# Patient Record
Sex: Male | Born: 1980 | Race: Black or African American | Hispanic: No | Marital: Single | Smoking: Current some day smoker
Health system: Southern US, Community
[De-identification: ages and names within clinical notes are randomized; demographics above are authoritative.]

## PROBLEM LIST (undated history)

## (undated) DIAGNOSIS — N200 Calculus of kidney: Secondary | ICD-10-CM

## (undated) DIAGNOSIS — J45909 Unspecified asthma, uncomplicated: Secondary | ICD-10-CM

## (undated) DIAGNOSIS — Z87442 Personal history of urinary calculi: Secondary | ICD-10-CM

## (undated) DIAGNOSIS — R51 Headache: Secondary | ICD-10-CM

## (undated) DIAGNOSIS — R519 Headache, unspecified: Secondary | ICD-10-CM

---

## 2006-08-25 ENCOUNTER — Emergency Department (HOSPITAL_COMMUNITY): Admission: EM | Admit: 2006-08-25 | Discharge: 2006-08-25 | Payer: Self-pay | Admitting: Emergency Medicine

## 2010-08-07 ENCOUNTER — Emergency Department (HOSPITAL_BASED_OUTPATIENT_CLINIC_OR_DEPARTMENT_OTHER)
Admission: EM | Admit: 2010-08-07 | Discharge: 2010-08-07 | Disposition: A | Discharge status: Home / Self Care | Payer: Self-pay | Attending: Emergency Medicine | Admitting: Emergency Medicine

## 2010-08-07 ENCOUNTER — Encounter: Payer: Self-pay | Admitting: Student

## 2010-08-07 DIAGNOSIS — F17200 Nicotine dependence, unspecified, uncomplicated: Secondary | ICD-10-CM | POA: Insufficient documentation

## 2010-08-07 DIAGNOSIS — R300 Dysuria: Secondary | ICD-10-CM | POA: Insufficient documentation

## 2010-08-07 DIAGNOSIS — N342 Other urethritis: Secondary | ICD-10-CM | POA: Insufficient documentation

## 2010-08-07 DIAGNOSIS — R109 Unspecified abdominal pain: Secondary | ICD-10-CM | POA: Insufficient documentation

## 2010-08-07 MED ORDER — CEFIXIME 400 MG PO TABS
400.0000 mg | ORAL_TABLET | Freq: Once | ORAL | Status: AC
  Administered 2010-08-07: 400 mg via ORAL
  Filled 2010-08-07: qty 1

## 2010-08-07 MED ORDER — AZITHROMYCIN 250 MG PO TABS
1000.0000 mg | ORAL_TABLET | Freq: Once | ORAL | Status: AC
  Administered 2010-08-07: 1000 mg via ORAL
  Filled 2010-08-07: qty 4

## 2010-08-07 NOTE — ED Notes (Signed)
Pt in with c/o lower abd pain and pain in left groin region and reports possible STD. Pain also in left testicle. Reports dysuria with a clear dc s/p urination.

## 2010-08-07 NOTE — ED Provider Notes (Signed)
History     Chief Complaint  Patient presents with  . Abdominal Pain   HPI Comments: Pt reports burning with urination,  recent unprotected sex  Patient is a 30 y.o. male presenting with abdominal pain. The history is provided by the patient.  Abdominal Pain The primary symptoms of the illness do not include abdominal pain, nausea, vomiting or diarrhea. The current episode started 2 days ago. The onset of the illness was gradual. The problem has not changed since onset. The illness is associated with recent sexual activity. The patient has not had a change in bowel habit. Additional symptoms associated with the illness include urgency. Significant associated medical issues do not include HIV.    No past medical history on file.  No past surgical history on file.  No family history on file.  History  Substance Use Topics  . Smoking status: Current Some Day Smoker  . Smokeless tobacco: Not on file  . Alcohol Use: Yes      Review of Systems  Gastrointestinal: Negative for nausea, vomiting, abdominal pain and diarrhea.  Genitourinary: Positive for urgency and discharge.    Physical Exam  BP 126/77  Pulse 68  Temp(Src) 99 F (37.2 C) (Oral)  Resp 20  Wt 158 lb (71.668 kg)  SpO2 100%  Physical Exam  Constitutional: He is oriented to person, place, and time. He appears well-developed and well-nourished.  HENT:  Head: Normocephalic.  Eyes: Conjunctivae are normal. Pupils are equal, round, and reactive to light.  Abdominal: Soft.  Genitourinary: Penile tenderness present.  Musculoskeletal: Normal range of motion.  Neurological: He is alert and oriented to person, place, and time. He has normal reflexes.  Skin: Skin is warm and dry.  Psychiatric: He has a normal mood and affect.    ED Course  Procedures  MDM Gc/ct/rpr ordered      Langston Masker, PA 08/07/10 1249  [place], [place] 08/07/10 1254

## 2010-08-07 NOTE — ED Notes (Signed)
Urine collected at triage.

## 2010-08-08 LAB — GC/CHLAMYDIA PROBE AMP, GENITAL
Chlamydia, DNA Probe: NEGATIVE
GC Probe Amp, Genital: NEGATIVE

## 2010-08-20 NOTE — ED Provider Notes (Signed)
History/physical exam/procedure(s) were performed by non-physician practitioner and as supervising physician I was immediately available for consultation/collaboration. I have reviewed all notes and am in agreement with care and plan.   Hilario Quarry, MD 08/20/10 [phone]

## 2012-05-09 ENCOUNTER — Encounter (HOSPITAL_BASED_OUTPATIENT_CLINIC_OR_DEPARTMENT_OTHER): Payer: Self-pay | Admitting: Emergency Medicine

## 2012-05-09 ENCOUNTER — Emergency Department (HOSPITAL_BASED_OUTPATIENT_CLINIC_OR_DEPARTMENT_OTHER)
Admission: EM | Admit: 2012-05-09 | Discharge: 2012-05-09 | Disposition: A | Discharge status: Home / Self Care | Payer: Self-pay | Attending: Emergency Medicine | Admitting: Emergency Medicine

## 2012-05-09 DIAGNOSIS — F17200 Nicotine dependence, unspecified, uncomplicated: Secondary | ICD-10-CM | POA: Insufficient documentation

## 2012-05-09 DIAGNOSIS — K047 Periapical abscess without sinus: Secondary | ICD-10-CM | POA: Insufficient documentation

## 2012-05-09 DIAGNOSIS — N509 Disorder of male genital organs, unspecified: Secondary | ICD-10-CM | POA: Insufficient documentation

## 2012-05-09 LAB — URINALYSIS, ROUTINE W REFLEX MICROSCOPIC
Bilirubin Urine: NEGATIVE
Glucose, UA: NEGATIVE mg/dL
Hgb urine dipstick: NEGATIVE
Ketones, ur: 15 mg/dL — AB
Protein, ur: NEGATIVE mg/dL
Urobilinogen, UA: 0.2 mg/dL (ref 0.0–1.0)

## 2012-05-09 MED ORDER — PENICILLIN V POTASSIUM 500 MG PO TABS: 500.0000 mg | ORAL_TABLET | Freq: Four times a day (QID) | ORAL | Status: AC

## 2012-05-09 MED ORDER — IBUPROFEN 600 MG PO TABS: 600.0000 mg | ORAL_TABLET | Freq: Four times a day (QID) | ORAL | Status: DC | PRN

## 2012-05-09 MED ORDER — OXYCODONE-ACETAMINOPHEN 5-325 MG PO TABS: 1.0000 | ORAL_TABLET | Freq: Four times a day (QID) | ORAL | Status: DC | PRN

## 2012-05-09 NOTE — ED Notes (Signed)
Pt having right upper dental pain and right testicle pain a few days ago.  No dysuria, no heavy lifting, no penile drainage.

## 2012-05-09 NOTE — ED Provider Notes (Signed)
History     CSN: [id number]  Arrival date & time 05/09/12  1016   First MD Initiated Contact with Patient 05/09/12 1102      Chief Complaint  Patient presents with  . Dental Pain  . Testicle Pain    (Consider location/radiation/quality/duration/timing/severity/associated sxs/prior treatment) HPI Pt presenting with c/o right upper dental pain which began 2 days ago and then today he noted that he was having swelling in gums and right side of face.  No fever, no difficulty breathing or swallowing.  This was his primary reason for coming to the ED.  He also states he had a sharp pain in his right testicle which was fleeting and is no longer present.  He is concerned that this represents an STD.  No dysuria, no scrotal swelling or discoloration.  No rash.  There are no other associated systemic symptoms, there are no other alleviating or modifying factors.   No past medical history on file.  No past surgical history on file.  No family history on file.  History  Substance Use Topics  . Smoking status: Current Some Day Smoker  . Smokeless tobacco: Not on file  . Alcohol Use: Yes      Review of Systems ROS reviewed and all otherwise negative except for mentioned in HPI  Allergies  Review of patient's allergies indicates no known allergies.  Home Medications   Current Outpatient Rx  Name  Route  Sig  Dispense  Refill  . ibuprofen (ADVIL,MOTRIN) 600 MG tablet   Oral   Take 1 tablet (600 mg total) by mouth every 6 (six) hours as needed for pain.   30 tablet   0   . oxyCODONE-acetaminophen (PERCOCET/ROXICET) 5-325 MG per tablet   Oral   Take 1-2 tablets by mouth every 6 (six) hours as needed for pain.   15 tablet   0   . penicillin v potassium (VEETID) 500 MG tablet   Oral   Take 1 tablet (500 mg total) by mouth 4 (four) times daily.   40 tablet   0     BP 100/55  Pulse 72  Temp(Src) 98.5 F (36.9 C) (Oral)  Resp 18  Ht 5\' 9"  (1.753 m)  Wt 162 lb (73.483  kg)  BMI 23.91 kg/m2  SpO2 98% Vitals reviewed Physical Exam Physical Examination: General appearance - alert, well appearing, and in no distress Mental status - alert, oriented to person, place, and time Eyes - pupils equal and reactive, extraocular eye movements intact Mouth - mucous membranes moist, pharynx normal without lesions Neck - supple, no significant adenopathy Chest - clear to auscultation, no wheezes, rales or rhonchi, symmetric air entry Heart - normal rate, regular rhythm, normal S1, S2, no murmurs, rubs, clicks or gallops Abdomen - soft, nontender, nondistended, no masses or organomegaly GU Male - no penile lesions or discharge, no testicular masses or tenderness, no hernias Extremities - peripheral pulses normal, no pedal edema, no clubbing or cyanosis Skin - normal coloration and turgor, no rashes  ED Course  Procedures (including critical care time)  Labs Reviewed  URINALYSIS, ROUTINE W REFLEX MICROSCOPIC - Abnormal; Notable for the following:    Specific Gravity, Urine 1.037 (*)    Ketones, ur 15 (*)    All other components within normal limits  GC/CHLAMYDIA PROBE AMP   No results found.   1. Dental abscess       MDM  Pt presenting with c/o dental pain and swelling.  Will start on  po abx and pain medication. Pt encouraged to arrange for dental followup.  Also c/o fleeting testicular pain, normal exam today.  GC/chlamydia sent at his request, ua reassuring as well.  Discharged with strict return precautions.  Pt agreeable with plan.        Ethelda Chick, MD 05/09/12 [phone]

## 2012-09-09 ENCOUNTER — Encounter (HOSPITAL_BASED_OUTPATIENT_CLINIC_OR_DEPARTMENT_OTHER): Payer: Self-pay | Admitting: Family Medicine

## 2012-09-09 ENCOUNTER — Emergency Department (HOSPITAL_BASED_OUTPATIENT_CLINIC_OR_DEPARTMENT_OTHER)
Admission: EM | Admit: 2012-09-09 | Discharge: 2012-09-09 | Disposition: A | Discharge status: Home / Self Care | Payer: Self-pay | Attending: Emergency Medicine | Admitting: Emergency Medicine

## 2012-09-09 ENCOUNTER — Emergency Department (HOSPITAL_BASED_OUTPATIENT_CLINIC_OR_DEPARTMENT_OTHER): Payer: Self-pay

## 2012-09-09 DIAGNOSIS — M546 Pain in thoracic spine: Secondary | ICD-10-CM

## 2012-09-09 DIAGNOSIS — R05 Cough: Secondary | ICD-10-CM | POA: Insufficient documentation

## 2012-09-09 DIAGNOSIS — F17200 Nicotine dependence, unspecified, uncomplicated: Secondary | ICD-10-CM | POA: Insufficient documentation

## 2012-09-09 DIAGNOSIS — R059 Cough, unspecified: Secondary | ICD-10-CM | POA: Insufficient documentation

## 2012-09-09 DIAGNOSIS — M549 Dorsalgia, unspecified: Secondary | ICD-10-CM | POA: Insufficient documentation

## 2012-09-09 DIAGNOSIS — R0789 Other chest pain: Secondary | ICD-10-CM | POA: Insufficient documentation

## 2012-09-09 NOTE — ED Notes (Signed)
Pt c/o cough x 1 wk and upper back pain with deep inspiration x 2 days. Pt denies fever, n/v.

## 2012-09-09 NOTE — ED Notes (Signed)
Patient transported to X-ray 

## 2012-09-09 NOTE — ED Provider Notes (Signed)
CSN: [id number]     Arrival date & time 09/09/12  1143 History     First MD Initiated Contact with Patient 09/09/12 1156     Chief Complaint  Patient presents with  . Cough  . Back Pain   (Consider location/radiation/quality/duration/timing/severity/associated sxs/prior Treatment) Patient is a 32 y.o. male presenting with back pain. The history is provided by the patient.  Back Pain Pain location: upper right back. Quality:  Stabbing Pain severity:  Moderate Pain is:  Same all the time Onset quality:  Sudden Duration:  2 days Timing:  Constant Progression:  Waxing and waning Chronicity:  New Relieved by:  None tried Worsened by:  Coughing and deep breathing Associated symptoms: chest pain (right chest)   Associated symptoms: no abdominal pain, no fever, no numbness and no weakness     History reviewed. No pertinent past medical history. History reviewed. No pertinent past surgical history. No family history on file. History  Substance Use Topics  . Smoking status: Current Some Day Smoker  . Smokeless tobacco: Not on file  . Alcohol Use: Yes    Review of Systems  Constitutional: Negative for fever and chills.  Respiratory: Positive for cough. Negative for shortness of breath.   Cardiovascular: Positive for chest pain (right chest).  Gastrointestinal: Negative for vomiting and abdominal pain.  Musculoskeletal: Positive for back pain.  Neurological: Negative for weakness and numbness.  All other systems reviewed and are negative.    Allergies  Review of patient's allergies indicates no known allergies.  Home Medications   Current Outpatient Rx  Name  Route  Sig  Dispense  Refill  . ibuprofen (ADVIL,MOTRIN) 600 MG tablet   Oral   Take 1 tablet (600 mg total) by mouth every 6 (six) hours as needed for pain.   30 tablet   0   . oxyCODONE-acetaminophen (PERCOCET/ROXICET) 5-325 MG per tablet   Oral   Take 1-2 tablets by mouth every 6 (six) hours as needed for  pain.   15 tablet   0    BP 123/64  Pulse 83  Temp(Src) 98.1 F (36.7 C) (Oral)  Resp 16  SpO2 98% Physical Exam  Nursing note and vitals reviewed. Constitutional: He is oriented to person, place, and time. He appears well-developed and well-nourished.  HENT:  Head: Normocephalic and atraumatic.  Right Ear: External ear normal.  Left Ear: External ear normal.  Nose: Nose normal.  Eyes: Right eye exhibits no discharge. Left eye exhibits no discharge.  Neck: Neck supple.  Cardiovascular: Normal rate, regular rhythm, normal heart sounds and intact distal pulses.   Pulmonary/Chest: Effort normal and breath sounds normal. He has no wheezes. He exhibits tenderness (right anterior chest).  Abdominal: Soft. There is no tenderness.  Musculoskeletal: He exhibits no edema.       Cervical back: He exhibits tenderness.       Back:  Neurological: He is alert and oriented to person, place, and time.  Skin: Skin is warm and dry. No rash noted.    ED Course   Procedures (including critical care time)   Date: 09/09/2012  Rate: 71  Rhythm: normal sinus rhythm  QRS Axis: normal  Intervals: normal  ST/T Wave abnormalities: normal  Conduction Disutrbances:none  Narrative Interpretation: Normal EKG  Old EKG Reviewed: none available   Labs Reviewed - No data to display Dg Chest 2 View  09/09/2012   *RADIOLOGY REPORT*  Clinical Data: Chest and back pain for a few days, cough, smoker  CHEST -  2 VIEW  Comparison: None  Findings: Normal heart size, mediastinal contours, and pulmonary vascularity. Lungs clear. No pleural effusion or pneumothorax. Bones unremarkable.  IMPRESSION: No acute abnormalities.   Original Report Authenticated By: Ulyses Southward, M.D.   1. Acute thoracic back pain   2. Musculoskeletal chest pain     MDM  32 year old male with a two-week history of a nonproductive cough and now a 2 day history of a right upper back pain and right upper chest pain. Pain is worse with deep  inspiration and range of motion. X-ray clear. EKG is normal. Patient is a low risk for PE and as PERC negative. Do not feel like further workup is necessary. With his cough and recent working out at his pain is musculoskeletal etiology. Will treat with NSAIDs and rest.    Audree Camel, MD 09/09/12 1243

## 2013-03-04 ENCOUNTER — Encounter (HOSPITAL_COMMUNITY): Payer: Self-pay | Admitting: Emergency Medicine

## 2013-03-04 ENCOUNTER — Emergency Department (HOSPITAL_COMMUNITY)
Admission: EM | Admit: 2013-03-04 | Discharge: 2013-03-04 | Discharge status: Against Medical Advice | Payer: Self-pay | Attending: Emergency Medicine | Admitting: Emergency Medicine

## 2013-03-04 DIAGNOSIS — T50901A Poisoning by unspecified drugs, medicaments and biological substances, accidental (unintentional), initial encounter: Secondary | ICD-10-CM | POA: Insufficient documentation

## 2013-03-04 DIAGNOSIS — T50904A Poisoning by unspecified drugs, medicaments and biological substances, undetermined, initial encounter: Secondary | ICD-10-CM | POA: Insufficient documentation

## 2013-03-04 DIAGNOSIS — Y929 Unspecified place or not applicable: Secondary | ICD-10-CM | POA: Insufficient documentation

## 2013-03-04 DIAGNOSIS — F17200 Nicotine dependence, unspecified, uncomplicated: Secondary | ICD-10-CM | POA: Insufficient documentation

## 2013-03-04 DIAGNOSIS — Y9389 Activity, other specified: Secondary | ICD-10-CM | POA: Insufficient documentation

## 2013-03-04 HISTORY — DX: Unspecified asthma, uncomplicated: J45.909

## 2013-03-04 LAB — URINALYSIS, ROUTINE W REFLEX MICROSCOPIC
Bilirubin Urine: NEGATIVE
GLUCOSE, UA: NEGATIVE mg/dL
HGB URINE DIPSTICK: NEGATIVE
Ketones, ur: NEGATIVE mg/dL
Leukocytes, UA: NEGATIVE
Nitrite: NEGATIVE
PH: 6.5 (ref 5.0–8.0)
Protein, ur: NEGATIVE mg/dL
SPECIFIC GRAVITY, URINE: 1.028 (ref 1.005–1.030)
UROBILINOGEN UA: 1 mg/dL (ref 0.0–1.0)

## 2013-03-04 LAB — RAPID URINE DRUG SCREEN, HOSP PERFORMED
Amphetamines: NOT DETECTED
Barbiturates: NOT DETECTED
Benzodiazepines: NOT DETECTED
Cocaine: NOT DETECTED
OPIATES: NOT DETECTED
Tetrahydrocannabinol: POSITIVE — AB

## 2013-03-04 MED ORDER — CHARCOAL ACTIVATED PO LIQD
50.0000 g | Freq: Once | ORAL | Status: DC
  Filled 2013-03-04: qty 240

## 2013-03-04 NOTE — ED Notes (Signed)
Colton RiceHannah, Pa and this RN back at the bedside. Pt denying swallowing anything and refuses to allows us to place him on the monitor, place an IV, or draw blood. PA explained risks to him and pt continues to deny swallowing anything. Explained to patient that our purpose is to make sure he is okay medically. Pt states he would rather anything be done to him with a warrant.

## 2013-03-04 NOTE — ED Provider Notes (Addendum)
33 year old African American male presents to the emergency department with police. Detective does have concern that the patient may have swallowed drugs.  Physical exam is unremarkable. Vital signs are normal. Patient does not display any evidence of toxidrome at this time.  Patient is adamantly refusing any type of ER workup and/or treatment. He repeatedly denies taking any drugs of any type. He states that he was drinking Kool-Aid only.  Ms. Margot AblesMerrill and myself had a lengthy discussion with the patient and emphasized the potential for serious injury and/or death if he did ingest drugs. He stated, "That he has children and he doesn't want to die" and repeatedly denies ingestion. He is alert and oriented, cogent, logical, without signs of psychosis or delirium, and appropriate. Multiple attempts were made to have the patient change his mind however he states he wants to leave.  I also informed him that any treatment and/or information we obtained during this visit would not be used against him in a court of law, but he still wants to leave. He was informed that he will be discharged AGAINST MEDICAL ADVICE. He is aware he may come back at any time for any issues.  He is to be taken to jail where he can be observed.    ER return precautions were given.      Darlys Galesavid Masneri, MD 03/04/13 1409  Darlys Galesavid Masneri, MD 03/04/13 [phone]

## 2013-03-04 NOTE — Discharge Instructions (Signed)
Poisoning Information, Adult  Poisoning is illness caused by eating, drinking, touching, or inhaling a harmful substance. The damaging effects on the person's health will vary depending on the type of poison, the amount of exposure, and the duration of exposure before treatment. These effects may range from mild to very severe or even fatal.   Most poisonings take place in the home and involve common household products. They can also occur in the workplace, especially in industrial or manufacturing facilities. Poisoning is more common in children than adults. However, poisoning often causes more serious illness in adults. Poisonings are often accidental, but there are also many cases in which a person intentionally ingests poison.  WHAT THINGS MAY BE POISONOUS?   A poison can be any substance that causes illness or harm to the body. Poisoning is often caused by products that are commonly found in homes. Many substances can become poisonous if used in ways or amounts that are not appropriate. Some common products that can cause poisoning are:   · Medicines, including prescription medicines, over-the-counter pain medicines, vitamins, iron pills, and herbal supplements.  · Cleaning or laundry products.  · Paint and paint thinner.  · Weed or insect killers.  · Perfume, hair spray, or nail products.  · Alcohol.  · Plants, such as philodendron, poinsettia, oleander, castor bean, cactus, and tomato plants.  · Batteries.  · Furniture polish.  · Drain cleaners.  · Antifreeze or other automotive products.  · Gasoline, lighter fluid, or lamp oil.  · Carbon monoxide gas from furnaces or automobiles.  · Toxic fumes from the burning of plastics or certain other materials.  WHAT ARE SOME FIRST-AID MEASURES FOR POISONING?  The local poison control center must be contacted whenever a person may have been exposed to poison. The poison control specialist will often give a set of directions to follow over the phone. These directions  may include the following:  · Remove any substance that is still in the mouth if the poison was not food or medicine. Drink a small amount of water.  · Keep the medicine container if too much medicine or the wrong medicine was swallowed. Use it to identify the medicine to the poison control specialist.   · Get away from the area where exposure occurred as soon as possible if the poison was from fumes or chemicals.  · Get fresh air as soon as possible if a poison was inhaled.  · Remove any affected clothing and rinse the skin with water if a poison got on the skin.   · Rinse the eyes with water if a poison or chemical got in the eyes.  · Begin cardiopulmonary resuscitation (CPR) if breathing stops.  HOW CAN YOU PREVENT POISONING?  Take these steps to help prevent poisoning:  · Keep medicines and chemical products in their original containers. Many of these come in child-safe packaging. Store them in areas out of reach of children.  · Educate others about the dangers of possible poisons.  · Read labels before using medicine or household products. Leave the original labels on the containers.  · Always turn on a light when taking medicine. Check the dosage every time.    · Close the containers tightly after using medicine or chemical products.  · Get rid of unneeded and outdated medicines by following the specific disposal instructions on the medicine label or the patient information that came with the medicine. Do not put medicine in the trash or flush it down the toilet. Use the community's   drug take-back program to dispose of medicine. If these options are not available, take the medicine out of the original container and mix it with an undesirable substance, such as coffee grounds or kitty litter. Seal the mixture in a sealable bag, can, or other container and throw it away.   · Keep all dangerous household products (such as lighter fluid, paint thinner and remover, gasoline, and antifreeze) in locked  cabinets.  · Do not mix different household chemicals with each other.  · Use protective equipment (gloves, goggles, masks, aprons) as needed when using chemicals or cleaners.  · Install a carbon monoxide detector in your home.  WHEN SHOULD YOU SEEK HELP?   Contact the poison control center whenever you suspect that a person has been exposed to poison. Call 1-800-222-1222 (in the [place] to reach a poison center for your area. If you are outside the [place] ask your caregiver what the phone number is for your local poison control center. Keep the phone number posted near your phone. Make sure everyone in your household knows where to find the number.  The local emergency services (911 in [place] must be contacted if a person has been exposed to poison and:   · Has trouble breathing or stops breathing.  · Develops chest pain.  · Has trouble staying awake or becomes unconscious.  · Has a seizure.  · Has severe vomiting or bleeding.  · Has a worsening headache.  · Has a decreased level of alertness.  · Develops a widespread rash that may or may not be painful.  · Has changes in vision.  · Has difficulty swallowing.  · Develops severe abdominal pain.  FOR MORE INFORMATION   American Association of Poison Control Centers: www.aapcc.org  Document Released: 12/25/2011 Document Reviewed: 12/25/2011  Exit[employer]® Patient Information ©2014 [employer].

## 2013-03-04 NOTE — ED Provider Notes (Signed)
CSN: [id number]     Arrival date & time 03/04/13  1251 History   First MD Initiated Contact with Patient 03/04/13 1305     Chief Complaint  Patient presents with  . Ingestion     (Consider location/radiation/quality/duration/timing/severity/associated sxs/prior Treatment) HPI Comments: Patient is a 33 year old male who presents today brought in by the police. The police detective believe that just prior to arrival he ingested heroin. The police report that he was in the process of transporting approximately 6 grams of heroin. The patient denies any ingestion. The detective reports that he threw something in his mouth and then drank a large amount Kool-Aid when he saw the police officers coming. He denies any complaints at this time including chest pain, shortness of breath, nausea, vomiting, abdominal pain he states "you're going to need a search warrant to look for any drugs". And states "they took me away before I could smoke by blunt". The patient refuses all interventions or therapies.   The history is provided by the patient. No language interpreter was used.    No past medical history on file. No past surgical history on file. No family history on file. History  Substance Use Topics  . Smoking status: Current Some Day Smoker  . Smokeless tobacco: Not on file  . Alcohol Use: Yes    Review of Systems  Constitutional: Negative for fever and chills.  Respiratory: Negative for shortness of breath.   Cardiovascular: Negative for chest pain and palpitations.  Gastrointestinal: Negative for nausea, vomiting and abdominal pain.  Psychiatric/Behavioral: Negative for suicidal ideas and self-injury.  All other systems reviewed and are negative.      Allergies  Review of patient's allergies indicates no known allergies.  Home Medications   Current Outpatient Rx  Name  Route  Sig  Dispense  Refill  . ibuprofen (ADVIL,MOTRIN) 600 MG tablet   Oral   Take 1 tablet (600 mg total) by  mouth every 6 (six) hours as needed for pain.   30 tablet   0   . oxyCODONE-acetaminophen (PERCOCET/ROXICET) 5-325 MG per tablet   Oral   Take 1-2 tablets by mouth every 6 (six) hours as needed for pain.   15 tablet   0    BP 121/72  Pulse 93  Temp(Src) 98.3 F (36.8 C) (Oral)  Resp 20  Ht 5\' 9"  (1.753 m)  Wt 162 lb (73.483 kg)  BMI 23.91 kg/m2  SpO2 97% Physical Exam  Nursing note and vitals reviewed. Constitutional: He is oriented to person, place, and time. He appears well-developed and well-nourished. No distress.  HENT:  Head: Normocephalic and atraumatic.  Right Ear: External ear normal.  Left Ear: External ear normal.  Nose: Nose normal.  Eyes: Conjunctivae are normal. Pupils are equal, round, and reactive to light.  Neck: Normal range of motion. No tracheal deviation present.  Cardiovascular: Normal rate, regular rhythm and normal heart sounds.   Pulmonary/Chest: Effort normal and breath sounds normal. No stridor.  Abdominal: Soft. He exhibits no distension. There is no tenderness.  Musculoskeletal: Normal range of motion.  Neurological: He is alert and oriented to person, place, and time.  Skin: Skin is warm and dry. He is not diaphoretic.  Psychiatric: His behavior is normal. His speech is rapid and/or pressured. His speech is not delayed, not tangential and not slurred. He is communicative.  Patient appears frustrated, but his thought process is logical. He does not appear to be intoxicated.     ED Course  Procedures (including critical care time) Labs Review Labs Reviewed  URINE RAPID DRUG SCREEN (HOSP PERFORMED) - Abnormal; Notable for the following:    Tetrahydrocannabinol POSITIVE (*)    All other components within normal limits  URINALYSIS, ROUTINE W REFLEX MICROSCOPIC   Imaging Review No results found.  EKG Interpretation   None      1:19 PM Discussed case with Chales Abrahams from Poison Control. They recommend a dose of activated charcoal right  now. If patient consents, will get XR to look for baggy. We discussed that the patient is not compliant and the risks of CNS sx, respiratory depression, seizures, rhabdo, cardiovascular arrythmia and conduction disorder. She recommends symptomatic treatment. If patient seizes give Ativan or phenobarbital.   Patient denies all treatment including activated charcoal, blood work, ekg, cardiac monitoring. He consented to give a urine sample because he needed to urinate.    MDM   Final diagnoses:  Ingestion of unknown drug   Patient presents today after possible ingestion of an unknown substance. Patient is very adamant that he did not ingest anything at all, specifically heroin. No signs of toxidrome at this time. He is of sound mind, thoughts are logical. Patient is not clinically intoxicated. Both myself and Dr. Redgie Grayer have explained at length the risks of not accepting any treatment including death, seizures, MI, respiratory depression. The patient remains adamant that he did not swallow any foreign substance. He states specifically "I wouldn't risk my life to get away with something, I don't want to die". He also states, "I come to the doctor when I'm sick, I'm not sick". Patient voices understanding that he is leaving the emergency room against medical advice to go to jail. Both myself and Dr. Redgie Grayer evaluated the patient several different times over the patient's stay, both doing our best to encourage the patient to stay and allow [place] to treat him. I have encouraged the patient to seek reevaluation if he decompensates at all as well as to seek reevaluation if he changes his mind.   Mora Bellman, PA-C 03/04/13 [phone]

## 2013-03-04 NOTE — ED Notes (Signed)
Pt states that detective Eulah PontMurphy states he swallowed something. Pt denies swallowing anything. Denies any pain, fever.

## 2013-03-04 NOTE — ED Notes (Signed)
Hannah, PA at the bedside.  

## 2013-03-04 NOTE — ED Provider Notes (Signed)
Medical screening examination/treatment/procedure(s) were conducted as a shared visit with non-physician practitioner(s) and myself.  I personally evaluated the patient during the encounter.  EKG Interpretation   None       See my additional note.    Darlys Galesavid Masneri, MD 03/04/13 [phone]

## 2013-03-04 NOTE — ED Notes (Signed)
Dr. Redgie GrayerMasneri at the bedside to talk to patient.

## 2016-03-25 ENCOUNTER — Emergency Department (HOSPITAL_BASED_OUTPATIENT_CLINIC_OR_DEPARTMENT_OTHER)
Admission: EM | Admit: 2016-03-25 | Discharge: 2016-03-25 | Disposition: A | Discharge status: Home / Self Care | Payer: Self-pay | Attending: Emergency Medicine | Admitting: Emergency Medicine

## 2016-03-25 ENCOUNTER — Emergency Department (HOSPITAL_BASED_OUTPATIENT_CLINIC_OR_DEPARTMENT_OTHER): Payer: Self-pay

## 2016-03-25 ENCOUNTER — Encounter (HOSPITAL_BASED_OUTPATIENT_CLINIC_OR_DEPARTMENT_OTHER): Payer: Self-pay

## 2016-03-25 DIAGNOSIS — R509 Fever, unspecified: Secondary | ICD-10-CM | POA: Insufficient documentation

## 2016-03-25 DIAGNOSIS — Z202 Contact with and (suspected) exposure to infections with a predominantly sexual mode of transmission: Secondary | ICD-10-CM | POA: Insufficient documentation

## 2016-03-25 DIAGNOSIS — R109 Unspecified abdominal pain: Secondary | ICD-10-CM | POA: Insufficient documentation

## 2016-03-25 DIAGNOSIS — Z711 Person with feared health complaint in whom no diagnosis is made: Secondary | ICD-10-CM

## 2016-03-25 DIAGNOSIS — R05 Cough: Secondary | ICD-10-CM | POA: Insufficient documentation

## 2016-03-25 DIAGNOSIS — R059 Cough, unspecified: Secondary | ICD-10-CM

## 2016-03-25 LAB — URINALYSIS, ROUTINE W REFLEX MICROSCOPIC
BILIRUBIN URINE: NEGATIVE
Glucose, UA: NEGATIVE mg/dL
HGB URINE DIPSTICK: NEGATIVE
Ketones, ur: 15 mg/dL — AB
Nitrite: NEGATIVE
PH: 6.5 (ref 5.0–8.0)
Protein, ur: 100 mg/dL — AB
SPECIFIC GRAVITY, URINE: 1.029 (ref 1.005–1.030)

## 2016-03-25 LAB — URINALYSIS, MICROSCOPIC (REFLEX)

## 2016-03-25 MED ORDER — BENZONATATE 100 MG PO CAPS
100.0000 mg | ORAL_CAPSULE | Freq: Once | ORAL | Status: AC
  Administered 2016-03-25: 100 mg via ORAL
  Filled 2016-03-25: qty 1

## 2016-03-25 MED ORDER — ALBUTEROL SULFATE HFA 108 (90 BASE) MCG/ACT IN AERS
1.0000 | INHALATION_SPRAY | Freq: Four times a day (QID) | RESPIRATORY_TRACT | 0 refills | Status: DC | PRN
Start: 2016-03-25 — End: 2017-03-12

## 2016-03-25 MED ORDER — PREDNISONE 50 MG PO TABS
60.0000 mg | ORAL_TABLET | Freq: Once | ORAL | Status: AC
  Administered 2016-03-25: 60 mg via ORAL
  Filled 2016-03-25: qty 1

## 2016-03-25 MED ORDER — LIDOCAINE HCL (PF) 1 % IJ SOLN
INTRAMUSCULAR | Status: AC
  Administered 2016-03-25: 5 mL
  Filled 2016-03-25: qty 5

## 2016-03-25 MED ORDER — IPRATROPIUM-ALBUTEROL 0.5-2.5 (3) MG/3ML IN SOLN
3.0000 mL | Freq: Once | RESPIRATORY_TRACT | Status: AC
  Administered 2016-03-25: 3 mL via RESPIRATORY_TRACT
  Filled 2016-03-25: qty 3

## 2016-03-25 MED ORDER — PREDNISONE 20 MG PO TABS: 40.0000 mg | ORAL_TABLET | Freq: Every day | ORAL | 0 refills | Status: DC

## 2016-03-25 MED ORDER — AZITHROMYCIN 250 MG PO TABS
1000.0000 mg | ORAL_TABLET | Freq: Once | ORAL | Status: AC
  Administered 2016-03-25: 1000 mg via ORAL
  Filled 2016-03-25: qty 4

## 2016-03-25 MED ORDER — BENZONATATE 100 MG PO CAPS: 100.0000 mg | ORAL_CAPSULE | Freq: Three times a day (TID) | ORAL | 0 refills | Status: DC

## 2016-03-25 MED ORDER — CEFTRIAXONE SODIUM 250 MG IJ SOLR
250.0000 mg | Freq: Once | INTRAMUSCULAR | Status: AC
  Administered 2016-03-25: 250 mg via INTRAMUSCULAR
  Filled 2016-03-25: qty 250

## 2016-03-25 NOTE — ED Provider Notes (Signed)
[facility] Provider Note   CSN: [id number] Arrival date & time: 03/25/16  1336   By signing my name below, I, Soijett Blue, attest that this documentation has been prepared under the direction and in the presence of Wilburn Mylar, PA-C Electronically Signed: Soijett Blue, ED Scribe. 03/25/16. 4:11 PM.  History   Chief Complaint Chief Complaint  Patient presents with  . Cough  . Penile Discharge    HPI Colton Simon is a 36 y.o. male with a PMHx of asthma, who presents to the Emergency Department complaining of productive cough onset 2 weeks worsening 2-3 days ago. Pt reports associated resolved fever, chills, rhinorrhea, nasal congestion, and abdominal pain due to cough. Pt has tried nyquil, dayquil, and sudafed with relief of his symptoms. Denies wheezing and any other symptoms. Denies sick contacts. Cough has been persistent.   Pt secondarily complains of thick, creamy, penile discharge onset today. Pt reports associated bilateral testicular pain right > left. Pt has not tried any medications for the relief of his symptoms. He states that his last sexual interaction was 1 month ago. Pt denies dysuria, penile pain/swelling, testicular swelling, and any other symptoms.   The history is provided by the patient. No language interpreter was used.    Past Medical History:  Diagnosis Date  . Asthma     There are no active problems to display for this patient.   History reviewed. No pertinent surgical history.     Home Medications    Prior to Admission medications   Not on File    Family History No family history on file.  Social History Social History  Substance Use Topics  . Smoking status: Current Some Day Smoker    Packs/day: 0.50    Types: Cigarettes  . Smokeless tobacco: Never Used  . Alcohol use Yes     Comment: occ     Allergies   Patient has no known allergies.   Review of Systems Review of Systems  Constitutional: Positive  for chills and fever (resolved).  HENT: Positive for congestion and rhinorrhea.   Respiratory: Positive for cough. Negative for wheezing.   Gastrointestinal: Positive for abdominal pain (due to cough).  Genitourinary: Positive for discharge (thick and creamy) and testicular pain (right > left). Negative for penile pain, penile swelling and scrotal swelling.     Physical Exam Updated Vital Signs BP 107/68 (BP Location: Left Arm)   Pulse 107   Temp 99.2 F (37.3 C) (Oral)   Resp 18   Ht 5\' 9"  (1.753 m)   Wt 154 lb (69.9 kg)   SpO2 99%   BMI 22.74 kg/m   Physical Exam  Constitutional: He is oriented to person, place, and time. He appears well-developed and well-nourished. No distress.  HENT:  Head: Normocephalic and atraumatic.  Right Ear: Tympanic membrane, external ear and ear canal normal.  Left Ear: Tympanic membrane, external ear and ear canal normal.  Nose: Mucosal edema and rhinorrhea present.  Mouth/Throat: Uvula is midline, oropharynx is clear and moist and mucous membranes are normal.  Eyes: EOM are normal.  Neck: Normal range of motion. Neck supple.  Cardiovascular: Normal rate, regular rhythm, normal heart sounds and intact distal pulses.  Exam reveals no gallop and no friction rub.   No murmur heard. Pulmonary/Chest: Effort normal and breath sounds normal. No respiratory distress. He has no wheezes. He has no rales.  Productive cough noted  Abdominal: Soft. Bowel sounds are normal. He exhibits no distension. There is  no tenderness. There is no rebound and no guarding.  Genitourinary: Right testis shows tenderness. Right testis shows no swelling. Left testis shows no swelling and no tenderness. No penile tenderness. Discharge found.  Genitourinary Comments: Nurse chaperone present for exam. Mild tenderness to right testicle without any edema noted. No scrotal swelling. Clear penile discharge without any tenderness or lesions noted. No inguinal LAD noted.     Musculoskeletal: Normal range of motion.  Lymphadenopathy: No inguinal adenopathy noted on the right or left side.  Neurological: He is alert and oriented to person, place, and time.  Skin: Skin is warm and dry.  Psychiatric: He has a normal mood and affect. His behavior is normal.  Nursing note and vitals reviewed.    ED Treatments / Results  DIAGNOSTIC STUDIES: Oxygen Saturation is 99% on RA, nl by my interpretation.    COORDINATION OF CARE: 4:10 PM Discussed treatment plan with pt at bedside which includes CXR, US scrotum, [place] art/ven flow abd pelv doppler, UA, breathing treatment, GC/Chlamydia probe, rocephin, zithromax, and pt agreed to plan.   Labs (all labs ordered are listed, but only abnormal results are displayed) Labs Reviewed  URINALYSIS, ROUTINE W REFLEX MICROSCOPIC - Abnormal; Notable for the following:       Result Value   Color, Urine AMBER (*)    APPearance CLOUDY (*)    Ketones, ur 15 (*)    Protein, ur 100 (*)    Leukocytes, UA SMALL (*)    All other components within normal limits  URINALYSIS, MICROSCOPIC (REFLEX) - Abnormal; Notable for the following:    Bacteria, UA FEW (*)    Squamous Epithelial / LPF 0-5 (*)    All other components within normal limits  URINE CULTURE  GC/CHLAMYDIA PROBE AMP ([place]) NOT AT [facility]    Radiology Dg Chest 2 View  Result Date: 03/25/2016 CLINICAL DATA:  Cough. EXAM: CHEST  2 VIEW COMPARISON:  Radiographs of September 09, 2012. FINDINGS: The heart size and mediastinal contours are within normal limits. Both lungs are clear. No pneumothorax or pleural effusion is noted. The visualized skeletal structures are unremarkable. IMPRESSION: No active cardiopulmonary disease. Electronically Signed   By: Lupita Raider, M.D.   On: 03/25/2016 16:17   US Scrotum  Result Date: 03/25/2016 CLINICAL DATA:  36 year old male with bilateral testicular pain which began 2 days ago while coughing. Initial encounter. EXAM: SCROTAL ULTRASOUND  DOPPLER ULTRASOUND OF THE TESTICLES TECHNIQUE: Complete ultrasound examination of the testicles, epididymis, and other scrotal structures was performed. Color and spectral Doppler ultrasound were also utilized to evaluate blood flow to the testicles. COMPARISON:  None. FINDINGS: Right testicle Measurements: 4.5 x 2.1 x 2.7 cm. No mass or microlithiasis visualized. Left testicle Measurements: 4.0 x 1.9 x 2.7 cm. No mass or microlithiasis visualized. Right epididymis:  Normal in size and appearance. Left epididymis:  Normal in size and appearance. Hydrocele:  None visualized. Varicocele:  None visualized. Pulsed Doppler interrogation of both testes demonstrates normal low resistance arterial and venous waveforms bilaterally. Fairly symmetric appearing testicular vascularity, without definite hypervascularity. IMPRESSION: Negative for testicular torsion.  Negative scrotal ultrasound. Electronically Signed   By: Odessa Fleming M.D.   On: 03/25/2016 19:04   [place] Art/ven Flow Abd Pelv Doppler  Result Date: 03/25/2016 CLINICAL DATA:  36 year old male with bilateral testicular pain which began 2 days ago while coughing. Initial encounter. EXAM: SCROTAL ULTRASOUND DOPPLER ULTRASOUND OF THE TESTICLES TECHNIQUE: Complete ultrasound examination of the testicles, epididymis, and other scrotal structures  was performed. Color and spectral Doppler ultrasound were also utilized to evaluate blood flow to the testicles. COMPARISON:  None. FINDINGS: Right testicle Measurements: 4.5 x 2.1 x 2.7 cm. No mass or microlithiasis visualized. Left testicle Measurements: 4.0 x 1.9 x 2.7 cm. No mass or microlithiasis visualized. Right epididymis:  Normal in size and appearance. Left epididymis:  Normal in size and appearance. Hydrocele:  None visualized. Varicocele:  None visualized. Pulsed Doppler interrogation of both testes demonstrates normal low resistance arterial and venous waveforms bilaterally. Fairly symmetric appearing testicular  vascularity, without definite hypervascularity. IMPRESSION: Negative for testicular torsion.  Negative scrotal ultrasound. Electronically Signed   By: Odessa FlemingH  Hall M.D.   On: 03/25/2016 19:04    Procedures Procedures (including critical care time)  Medications Ordered in ED Medications  ipratropium-albuterol (DUONEB) 0.5-2.5 (3) MG/3ML nebulizer solution 3 mL (3 mLs Nebulization Given 03/25/16 1623)  predniSONE (DELTASONE) tablet 60 mg (60 mg Oral Given 03/25/16 1645)  benzonatate (TESSALON) capsule 100 mg (100 mg Oral Given 03/25/16 1646)  cefTRIAXone (ROCEPHIN) injection 250 mg (250 mg Intramuscular Given 03/25/16 1715)  azithromycin (ZITHROMAX) tablet 1,000 mg (1,000 mg Oral Given 03/25/16 1716)  lidocaine (PF) (XYLOCAINE) 1 % injection (5 mLs  Given 03/25/16 1716)     Initial Impression / Assessment and Plan / ED Course  I have reviewed the triage vital signs and the nursing notes.  Pertinent labs & imaging results that were available during my care of the patient were reviewed by me and considered in my medical decision making (see chart for details).     Patient presents to the ED with complaints of productive cough and penile discharge. Patient has resolved fever, chills, rhinorrhea, sore throat, body aches. Cough has persisted. Lungs are clear to auscultation bilaterally. Chest x-ray shows no focal infiltrate. Likely a viral bronchitis. Treated with DuoNeb times, steroids, Tessalon in the ED. Will be discharged with Tessalon, prednisone, albuterol inhaler. Patient also complains of penile discharge onset yesterday. GC chlamydia cultures are pending. We'll treat with Rocephin and azithromycin. Urine with mild signs of dehydration. There are small amount of leukocytes, few bacteria, squamous epithelium present. Urine culture was ordered. Will not treat for UTI at this time. Patient is afebrile and not tachycardic. Denies any abdominal pain. Patient does have testicular pain on exam. Ultrasound shows no  acute abnormalities concerning for epididymitis. Encouraged follow-up with his PCP. Given strict return precautions. Pt is hemodynamically stable, in NAD, & able to ambulate in the ED. Pain has been managed & has no complaints prior to dc. Pt is comfortable with above plan and is stable for discharge at this time. All questions were answered prior to disposition. Strict return precautions for f/u to the ED were discussed.  Final Clinical Impressions(s) / ED Diagnoses   Final diagnoses:  Cough  Concern about STD in male without diagnosis    New Prescriptions New Prescriptions   ALBUTEROL (PROVENTIL HFA;VENTOLIN HFA) 108 (90 BASE) MCG/ACT INHALER    Inhale 1-2 puffs into the lungs every 6 (six) hours as needed for wheezing or shortness of breath.   BENZONATATE (TESSALON) 100 MG CAPSULE    Take 1 capsule (100 mg total) by mouth every 8 (eight) hours.   PREDNISONE (DELTASONE) 20 MG TABLET    Take 2 tablets (40 mg total) by mouth daily with breakfast.   I personally performed the services described in this documentation, which was scribed in my presence. The recorded information has been reviewed and is accurate.  Rise Mu, PA-C 03/25/16 1924    Tilden Fossa, MD 03/25/16 [phone]

## 2016-03-25 NOTE — ED Notes (Signed)
Patient ambulatory to the restroom.

## 2016-03-25 NOTE — Discharge Instructions (Signed)
You have been treated for gonorrhea and chlamydia in the ED. Cultures are pending and will be called if they are positive. You have urine culture pending. If need for antibiotics will call in prescribing them. Chest x-ray shows no signs of pneumonia. This is likely a viral bronchitis. Please take the steroids starting tomorrow for 3 days. Used to [employer] for cough. May take the albuterol inhaler when he feels like her wheezing or having shortness of breath. Follow-up with a primary care doctor or return to ED if your symptoms worsen.

## 2016-03-25 NOTE — ED Notes (Signed)
ED Provider at bedside. With EMT

## 2016-03-25 NOTE — ED Triage Notes (Signed)
C/o flu like sx x 2 weeks-penile d/c x today-NAD-steady gait

## 2016-03-26 LAB — GC/CHLAMYDIA PROBE AMP (~~LOC~~) NOT AT ARMC
Chlamydia: NEGATIVE
NEISSERIA GONORRHEA: NEGATIVE

## 2016-03-26 LAB — URINE CULTURE: CULTURE: NO GROWTH

## 2017-03-12 ENCOUNTER — Emergency Department (HOSPITAL_COMMUNITY): Payer: Self-pay | Admitting: Certified Registered Nurse Anesthetist

## 2017-03-12 ENCOUNTER — Encounter (HOSPITAL_COMMUNITY)
Admission: EM | Disposition: A | Discharge status: Home / Self Care | Payer: Self-pay | Source: Home / Self Care | Attending: Emergency Medicine

## 2017-03-12 ENCOUNTER — Emergency Department (HOSPITAL_BASED_OUTPATIENT_CLINIC_OR_DEPARTMENT_OTHER): Payer: Self-pay

## 2017-03-12 ENCOUNTER — Ambulatory Visit (HOSPITAL_BASED_OUTPATIENT_CLINIC_OR_DEPARTMENT_OTHER)
Admission: EM | Admit: 2017-03-12 | Discharge: 2017-03-12 | Disposition: A | Discharge status: Home / Self Care | Payer: Self-pay | Attending: Emergency Medicine | Admitting: Emergency Medicine

## 2017-03-12 ENCOUNTER — Encounter (HOSPITAL_BASED_OUTPATIENT_CLINIC_OR_DEPARTMENT_OTHER): Payer: Self-pay

## 2017-03-12 ENCOUNTER — Emergency Department (HOSPITAL_COMMUNITY): Payer: Self-pay

## 2017-03-12 DIAGNOSIS — J45909 Unspecified asthma, uncomplicated: Secondary | ICD-10-CM | POA: Insufficient documentation

## 2017-03-12 DIAGNOSIS — N200 Calculus of kidney: Secondary | ICD-10-CM

## 2017-03-12 DIAGNOSIS — Z87442 Personal history of urinary calculi: Secondary | ICD-10-CM | POA: Insufficient documentation

## 2017-03-12 DIAGNOSIS — N132 Hydronephrosis with renal and ureteral calculous obstruction: Secondary | ICD-10-CM | POA: Insufficient documentation

## 2017-03-12 DIAGNOSIS — F17210 Nicotine dependence, cigarettes, uncomplicated: Secondary | ICD-10-CM | POA: Insufficient documentation

## 2017-03-12 HISTORY — PX: CYSTOSCOPY WITH RETROGRADE PYELOGRAM, URETEROSCOPY AND STENT PLACEMENT: SHX5789

## 2017-03-12 HISTORY — DX: Calculus of kidney: N20.0

## 2017-03-12 LAB — CBC WITH DIFFERENTIAL/PLATELET
BASOS PCT: 0 %
Basophils Absolute: 0 10*3/uL (ref 0.0–0.1)
Eosinophils Absolute: 0 10*3/uL (ref 0.0–0.7)
Eosinophils Relative: 0 %
HEMATOCRIT: 44.2 % (ref 39.0–52.0)
Hemoglobin: 14.6 g/dL (ref 13.0–17.0)
LYMPHS ABS: 0.7 10*3/uL (ref 0.7–4.0)
LYMPHS PCT: 5 %
MCH: 29.4 pg (ref 26.0–34.0)
MCHC: 33 g/dL (ref 30.0–36.0)
MCV: 88.9 fL (ref 78.0–100.0)
MONOS PCT: 6 %
Monocytes Absolute: 0.8 10*3/uL (ref 0.1–1.0)
NEUTROS PCT: 89 %
Neutro Abs: 12.9 10*3/uL — ABNORMAL HIGH (ref 1.7–7.7)
Platelets: 293 10*3/uL (ref 150–400)
RBC: 4.97 MIL/uL (ref 4.22–5.81)
RDW: 12.2 % (ref 11.5–15.5)
WBC: 14.5 10*3/uL — ABNORMAL HIGH (ref 4.0–10.5)

## 2017-03-12 LAB — URINALYSIS, ROUTINE W REFLEX MICROSCOPIC
GLUCOSE, UA: NEGATIVE mg/dL
KETONES UR: NEGATIVE mg/dL
Leukocytes, UA: NEGATIVE
Nitrite: NEGATIVE
PROTEIN: 100 mg/dL — AB
Specific Gravity, Urine: >1.03 — ABNORMAL HIGH (ref 1.005–1.030)
pH: 6 (ref 5.0–8.0)

## 2017-03-12 LAB — BASIC METABOLIC PANEL
Anion gap: 11 (ref 5–15)
BUN: 13 mg/dL (ref 6–20)
CO2: 28 mmol/L (ref 22–32)
CREATININE: 1.18 mg/dL (ref 0.61–1.24)
Calcium: 8.8 mg/dL — ABNORMAL LOW (ref 8.9–10.3)
Chloride: 103 mmol/L (ref 101–111)
GFR calc non Af Amer: >60 mL/min (ref >60)
Glucose, Bld: 128 mg/dL — ABNORMAL HIGH (ref 65–99)
Potassium: 3.9 mmol/L (ref 3.5–5.1)
Sodium: 142 mmol/L (ref 135–145)

## 2017-03-12 LAB — URINALYSIS, MICROSCOPIC (REFLEX)

## 2017-03-12 LAB — PROTIME-INR
INR: 0.86
Prothrombin Time: 11.6 seconds (ref 11.4–15.2)

## 2017-03-12 SURGERY — CYSTOURETEROSCOPY, WITH RETROGRADE PYELOGRAM AND STENT INSERTION
Anesthesia: General | Laterality: Bilateral

## 2017-03-12 MED ORDER — DEXAMETHASONE SODIUM PHOSPHATE 10 MG/ML IJ SOLN
INTRAMUSCULAR | Status: AC
  Filled 2017-03-12: qty 1

## 2017-03-12 MED ORDER — HYDROMORPHONE HCL 1 MG/ML IJ SOLN
0.2500 mg | INTRAMUSCULAR | Status: DC | PRN
  Administered 2017-03-12: 0.5 mg via INTRAVENOUS

## 2017-03-12 MED ORDER — PROMETHAZINE HCL 25 MG/ML IJ SOLN
6.2500 mg | INTRAMUSCULAR | Status: DC | PRN
Start: 2017-03-12 — End: 2017-03-12

## 2017-03-12 MED ORDER — MORPHINE SULFATE (PF) 4 MG/ML IV SOLN
6.0000 mg | Freq: Once | INTRAVENOUS | Status: AC
  Administered 2017-03-12: 4 mg via INTRAVENOUS
  Filled 2017-03-12: qty 2

## 2017-03-12 MED ORDER — TRAMADOL HCL 50 MG PO TABS: 50.0000 mg | ORAL_TABLET | Freq: Four times a day (QID) | ORAL | 0 refills | Status: DC | PRN

## 2017-03-12 MED ORDER — MORPHINE SULFATE (PF) 4 MG/ML IV SOLN
4.0000 mg | Freq: Once | INTRAVENOUS | Status: AC
  Administered 2017-03-12: 4 mg via INTRAVENOUS
  Filled 2017-03-12: qty 1

## 2017-03-12 MED ORDER — KETOROLAC TROMETHAMINE 15 MG/ML IJ SOLN
15.0000 mg | Freq: Once | INTRAMUSCULAR | Status: AC
  Administered 2017-03-12: 15 mg via INTRAVENOUS
  Filled 2017-03-12: qty 1

## 2017-03-12 MED ORDER — SODIUM CHLORIDE 0.9 % IV BOLUS (SEPSIS)
1000.0000 mL | Freq: Once | INTRAVENOUS | Status: AC
  Administered 2017-03-12: 1000 mL via INTRAVENOUS

## 2017-03-12 MED ORDER — ONDANSETRON HCL 4 MG/2ML IJ SOLN
INTRAMUSCULAR | Status: AC
  Filled 2017-03-12: qty 2

## 2017-03-12 MED ORDER — SODIUM CHLORIDE 0.9 % IR SOLN
Status: DC | PRN
  Administered 2017-03-12: 3000 mL via INTRAVESICAL

## 2017-03-12 MED ORDER — ONDANSETRON HCL 4 MG/2ML IJ SOLN
4.0000 mg | Freq: Once | INTRAMUSCULAR | Status: AC
  Administered 2017-03-12: 4 mg via INTRAVENOUS
  Filled 2017-03-12: qty 2

## 2017-03-12 MED ORDER — SULFAMETHOXAZOLE-TRIMETHOPRIM 800-160 MG PO TABS: 1.0000 | ORAL_TABLET | Freq: Two times a day (BID) | ORAL | 0 refills | Status: DC

## 2017-03-12 MED ORDER — ACETAMINOPHEN 10 MG/ML IV SOLN
INTRAVENOUS | Status: AC
  Filled 2017-03-12: qty 100

## 2017-03-12 MED ORDER — LACTATED RINGERS IV SOLN
INTRAVENOUS | Status: DC | PRN
  Administered 2017-03-12: 18:00:00 via INTRAVENOUS

## 2017-03-12 MED ORDER — OXYCODONE HCL 5 MG/5ML PO SOLN
5.0000 mg | Freq: Once | ORAL | Status: DC | PRN
  Filled 2017-03-12: qty 5

## 2017-03-12 MED ORDER — FENTANYL CITRATE (PF) 100 MCG/2ML IJ SOLN
INTRAMUSCULAR | Status: DC | PRN
  Administered 2017-03-12: 100 ug via INTRAVENOUS

## 2017-03-12 MED ORDER — PROPOFOL 10 MG/ML IV BOLUS
INTRAVENOUS | Status: AC
  Filled 2017-03-12: qty 40

## 2017-03-12 MED ORDER — LIDOCAINE 2% (20 MG/ML) 5 ML SYRINGE
INTRAMUSCULAR | Status: DC | PRN
  Administered 2017-03-12: 100 mg via INTRAVENOUS

## 2017-03-12 MED ORDER — OXYCODONE HCL 5 MG PO TABS: 5.0000 mg | ORAL_TABLET | Freq: Once | ORAL | Status: DC | PRN

## 2017-03-12 MED ORDER — PROPOFOL 10 MG/ML IV BOLUS
INTRAVENOUS | Status: DC | PRN
  Administered 2017-03-12: 300 mg via INTRAVENOUS

## 2017-03-12 MED ORDER — ACETAMINOPHEN 10 MG/ML IV SOLN
1000.0000 mg | Freq: Once | INTRAVENOUS | Status: AC
  Administered 2017-03-12: 1000 mg via INTRAVENOUS

## 2017-03-12 MED ORDER — CEFAZOLIN SODIUM-DEXTROSE 2-3 GM-%(50ML) IV SOLR
INTRAVENOUS | Status: DC | PRN
  Administered 2017-03-12: 2 g via INTRAVENOUS

## 2017-03-12 MED ORDER — LACTATED RINGERS IV SOLN
Freq: Once | INTRAVENOUS | Status: AC
  Administered 2017-03-12: 18:00:00 via INTRAVENOUS

## 2017-03-12 MED ORDER — IOHEXOL 300 MG/ML  SOLN
INTRAMUSCULAR | Status: DC | PRN
  Administered 2017-03-12: 10 mL

## 2017-03-12 MED ORDER — ONDANSETRON HCL 4 MG/2ML IJ SOLN
INTRAMUSCULAR | Status: DC | PRN
  Administered 2017-03-12: 4 mg via INTRAVENOUS

## 2017-03-12 MED ORDER — CEFAZOLIN SODIUM-DEXTROSE 2-4 GM/100ML-% IV SOLN
INTRAVENOUS | Status: AC
  Filled 2017-03-12: qty 100

## 2017-03-12 MED ORDER — HYDROMORPHONE HCL 1 MG/ML IJ SOLN
INTRAMUSCULAR | Status: AC
  Filled 2017-03-12: qty 1

## 2017-03-12 MED ORDER — DEXAMETHASONE SODIUM PHOSPHATE 4 MG/ML IJ SOLN
INTRAMUSCULAR | Status: DC | PRN
  Administered 2017-03-12: 4 mg via INTRAVENOUS

## 2017-03-12 MED ORDER — 0.9 % SODIUM CHLORIDE (POUR BTL) OPTIME
TOPICAL | Status: DC | PRN
  Administered 2017-03-12: 1000 mL

## 2017-03-12 SURGICAL SUPPLY — 15 items
BAG URO CATCHER STRL LF (MISCELLANEOUS) ×3 IMPLANT
CATH INTERMIT  6FR 70CM (CATHETERS) ×3 IMPLANT
CLOTH BEACON ORANGE TIMEOUT ST (SAFETY) ×3 IMPLANT
COVER FOOTSWITCH UNIV (MISCELLANEOUS) ×3 IMPLANT
COVER SURGICAL LIGHT HANDLE (MISCELLANEOUS) ×3 IMPLANT
GLOVE BIOGEL M STRL SZ7.5 (GLOVE) ×3 IMPLANT
GOWN STRL REUS W/TWL LRG LVL3 (GOWN DISPOSABLE) ×6 IMPLANT
GUIDEWIRE STR DUAL SENSOR (WIRE) ×3 IMPLANT
IV NS 1000ML (IV SOLUTION) ×2
IV NS 1000ML BAXH (IV SOLUTION) ×1 IMPLANT
MANIFOLD NEPTUNE II (INSTRUMENTS) ×3 IMPLANT
PACK CYSTO (CUSTOM PROCEDURE TRAY) ×3 IMPLANT
STENT URET 6FRX24 CONTOUR (STENTS) ×6 IMPLANT
TUBING CONNECTING 10 (TUBING) ×2 IMPLANT
TUBING CONNECTING 10' (TUBING) ×1

## 2017-03-12 NOTE — ED Provider Notes (Signed)
[facility] EMERGENCY DEPARTMENT Provider Note   CSN: [id number] Arrival date & time: 03/12/17  1130     History   Chief Complaint Chief Complaint  Patient presents with  . Abdominal Pain    HPI Colton Simon is a 37 y.o. male.  HPI   37 year old male with history of kidney stone 1 month ago here with severe left flank pain.  The patient has a history of large stone on the right that he was told he would need surgery for but the pain went away so he never followed up.  He is here today with acute onset of severe left flank pain.  The pain is aching, throbbing, severe.  Has had associated nausea and vomiting.  Pain worsens with certain positions.  No alleviating factors.  He has been vomiting.  He did not sleep throughout the remainder of the night.  No fevers.  No recent illness or trauma.  No other medical complaints.  Past Medical History:  Diagnosis Date  . Asthma   . Kidney stone     There are no active problems to display for this patient.   History reviewed. No pertinent surgical history.     Home Medications    Prior to Admission medications   Not on File    Family History No family history on file.  Social History Social History   Tobacco Use  . Smoking status: Current Some Day Smoker    Packs/day: 0.50    Types: Cigarettes  . Smokeless tobacco: Never Used  Substance Use Topics  . Alcohol use: Yes    Comment: occ  . Drug use: Yes    Types: Marijuana     Allergies   Patient has no known allergies.   Review of Systems Review of Systems  Gastrointestinal: Positive for abdominal pain and nausea.  Genitourinary: Positive for difficulty urinating and flank pain.  All other systems reviewed and are negative.    Physical Exam Updated Vital Signs BP 125/71 (BP Location: Right Arm)   Pulse (!) 54   Temp 97.6 F (36.4 C) (Oral)   Resp 16   Ht 5\' 10"  (1.778 m)   Wt 71.4 kg (157 lb 6.5 oz)   SpO2 100%   BMI 22.59 kg/m    Physical Exam  Constitutional: He is oriented to person, place, and time. He appears well-developed and well-nourished. He appears distressed.  HENT:  Head: Normocephalic and atraumatic.  Eyes: Conjunctivae are normal.  Neck: Neck supple.  Cardiovascular: Normal rate, regular rhythm and normal heart sounds. Exam reveals no friction rub.  No murmur heard. Pulmonary/Chest: Effort normal and breath sounds normal. No respiratory distress. He has no wheezes. He has no rales.  Abdominal: He exhibits no distension. There is tenderness in the periumbilical area, left upper quadrant and left lower quadrant.  Musculoskeletal: He exhibits no edema.  Neurological: He is alert and oriented to person, place, and time. He exhibits normal muscle tone.  Skin: Skin is warm. Capillary refill takes less than 2 seconds.  Psychiatric: He has a normal mood and affect.  Nursing note and vitals reviewed.    ED Treatments / Results  Labs (all labs ordered are listed, but only abnormal results are displayed) Labs Reviewed  URINALYSIS, ROUTINE W REFLEX MICROSCOPIC - Abnormal; Notable for the following components:      Result Value   Color, Urine AMBER (*)    APPearance TURBID (*)    Specific Gravity, Urine >1.030 (*)  Hgb urine dipstick LARGE (*)    Bilirubin Urine SMALL (*)    Protein, ur 100 (*)    All other components within normal limits  URINALYSIS, MICROSCOPIC (REFLEX) - Abnormal; Notable for the following components:   Bacteria, UA FEW (*)    Squamous Epithelial / LPF 0-5 (*)    All other components within normal limits  CBC WITH DIFFERENTIAL/PLATELET - Abnormal; Notable for the following components:   WBC 14.5 (*)    Neutro Abs 12.9 (*)    All other components within normal limits  BASIC METABOLIC PANEL - Abnormal; Notable for the following components:   Glucose, Bld 128 (*)    Calcium 8.8 (*)    All other components within normal limits  PROTIME-INR    EKG  EKG  Interpretation None       Radiology Ct Renal Stone Study  Result Date: 03/12/2017 CLINICAL DATA:  Left flank pain and hematuria. EXAM: CT ABDOMEN AND PELVIS WITHOUT CONTRAST TECHNIQUE: Multidetector CT imaging of the abdomen and pelvis was performed following the standard protocol without IV contrast. COMPARISON:  None. FINDINGS: Lower chest: No acute abnormality. Hepatobiliary: No focal liver abnormality is seen. No gallstones, gallbladder wall thickening, or biliary dilatation. Pancreas: Unremarkable. No pancreatic ductal dilatation or surrounding inflammatory changes. Spleen: Normal in size without focal abnormality. Adrenals/Urinary Tract: The adrenal glands are unremarkable. There is a 8 mm calculus in the right mid ureter without hydronephrosis. There is an 12 mm calculus at the left UPJ with resultant mild hydronephrosis. The bladder is decompressed. Stomach/Bowel: Stomach is within normal limits. Appendix appears normal. No evidence of bowel wall thickening, distention, or inflammatory changes. Vascular/Lymphatic: No significant vascular findings are present. No enlarged abdominal or pelvic lymph nodes. Reproductive: Prostate is unremarkable. Other: No abdominal wall hernia or abnormality. No abdominopelvic ascites. No pneumoperitoneum. Musculoskeletal: No acute or significant osseous findings. IMPRESSION: 1. 12 mm calculus at the left UPJ with resultant mild left hydronephrosis. 2. 8 mm calculus in the right mid ureter without hydronephrosis. Electronically Signed   By: Obie DredgeWilliam T Derry M.D.   On: 03/12/2017 13:11    Procedures Procedures (including critical care time)  Medications Ordered in ED Medications  sodium chloride 0.9 % bolus 1,000 mL (not administered)  sodium chloride 0.9 % bolus 1,000 mL (1,000 mLs Intravenous New Bag/Given 03/12/17 1256)  morphine 4 MG/ML injection 6 mg (4 mg Intravenous Given 03/12/17 1258)  ketorolac (TORADOL) 15 MG/ML injection 15 mg (15 mg Intravenous  Given 03/12/17 1257)  ondansetron (ZOFRAN) injection 4 mg (4 mg Intravenous Given 03/12/17 1257)  morphine 4 MG/ML injection 4 mg (4 mg Intravenous Given 03/12/17 1414)     Initial Impression / Assessment and Plan / ED Course  I have reviewed the triage vital signs and the nursing notes.  Pertinent labs & imaging results that were available during my care of the patient were reviewed by me and considered in my medical decision making (see chart for details).     37 year old male here with severe left flank pain.  Imaging shows 12 mm obstructing left stone and 9 mm stone on the right.  There is not appear to be right hydronephrosis.  Patient has moderate leukocytosis which is likely reactive but I do not suspect infected stone clinically.  His creatinine is likely at baseline.  He is been given IV fluids and antiemetics with pain control.  Given bilateral stones, will plan to discuss with urology.  Dr. Laverle PatterBorden of urology [facility].  Patient will need to be stented given bilateral nature of the stones, in order to prevent renal failure.  Patient did receive a dose of Toradol prior to diagnosis, but will hold on any further NSAIDs at this time.  IV fluids given.  Final Clinical Impressions(s) / ED Diagnoses   Final diagnoses:  Bilateral kidney stones    ED Discharge Orders    None       Shaune Pollack, MD 03/12/17 1415

## 2017-03-12 NOTE — Anesthesia Procedure Notes (Signed)
Procedure Name: LMA Insertion Date/Time: 03/12/2017 6:18 PM Performed by: Vanessa Durhamochran, Zaydyn Havey Glenn, CRNA Pre-anesthesia Checklist: Emergency Drugs available, Patient identified, Suction available and Patient being monitored Patient Re-evaluated:Patient Re-evaluated prior to induction Oxygen Delivery Method: Circle system utilized Preoxygenation: Pre-oxygenation with 100% oxygen Induction Type: IV induction Ventilation: Mask ventilation without difficulty LMA: LMA inserted LMA Size: 4.0 Number of attempts: 1 Placement Confirmation: positive ETCO2 and breath sounds checked- equal and bilateral Tube secured with: Tape Dental Injury: Teeth and Oropharynx as per pre-operative assessment

## 2017-03-12 NOTE — Consult Note (Signed)
Urology Consult   Physician requesting consult: Dr. Linwood Dibbles  Reason for consult: Bilateral ureteral calculi  History of Present Illness: Colton Simon is a 37 y.o. healthy gentleman with no history of prior urolithiasis.  He presented with flank pain a few months ago and was diagnosed with a ureteral stone.  He apparently did not follow-up as his symptoms improved.  He presented back to the emergency department today with complaints of severe left-sided flank pain.  A repeat CT scan demonstrated bilateral ureteral calculi.  His renal function has been normal.  He has been voiding.  He denies any fever.  His pain is particularly located in the left flank radiating to the left lower quadrant of the abdomen.  He denies a history of voiding or storage urinary symptoms, hematuria, UTIs, STDs, urolithiasis, GU malignancy/trauma/surgery.  Past Medical History:  Diagnosis Date  . Asthma   . Kidney stone     History reviewed. No pertinent surgical history.  Current Hospital Medications:  Home Meds:  No outpatient medications have been marked as taking for the 03/12/17 encounter [facility] Encounter).    Scheduled Meds: Continuous Infusions: PRN Meds:.  Allergies: No Known Allergies  History reviewed. No pertinent family history.  Social History:  reports that he has been smoking cigarettes.  He has been smoking about 0.50 packs per day. he has never used smokeless tobacco. He reports that he drinks alcohol. He reports that he uses drugs. Drug: Marijuana.  ROS: A complete review of systems was performed.  All systems are negative except for pertinent findings as noted.  Physical Exam:  Vital signs in last 24 hours: Temp:  [97.5 F (36.4 C)-97.6 F (36.4 C)] 97.5 F (36.4 C) (02/20 1505) Pulse Rate:  [52-69] 69 (02/20 1505) Resp:  [16-20] 18 (02/20 1505) BP: (110-138)/(68-83) 110/68 (02/20 1505) SpO2:  [99 %-100 %] 99 % (02/20 1505) Weight:  [71.4 kg (157 lb 6.5 oz)] 71.4 kg  (157 lb 6.5 oz) (02/20 1133) Constitutional:  Alert and oriented, No acute distress Cardiovascular: Regular rate and rhythm, No JVD Respiratory: Normal respiratory effort, Lungs clear bilaterally GI: Moderate left CVA tenderness.  Soft, nontender, nondistended. GU: Moderate left CVA tenderness. Lymphatic: No lymphadenopathy Neurologic: Grossly intact, no focal deficits Psychiatric: Normal mood and affect  Laboratory Data:  Recent Labs    03/12/17 1245  WBC 14.5*  HGB 14.6  HCT 44.2  PLT 293    Recent Labs    03/12/17 1245  NA 142  K 3.9  CL 103  GLUCOSE 128*  BUN 13  CALCIUM 8.8*  CREATININE 1.18     Results for orders placed or performed during the hospital encounter of 03/12/17 (from the past 24 hour(s))  Urinalysis, Routine w reflex microscopic     Status: Abnormal   Collection Time: 03/12/17 11:39 AM  Result Value Ref Range   Color, Urine AMBER (A) YELLOW   APPearance TURBID (A) CLEAR   Specific Gravity, Urine >1.030 (H) 1.005 - 1.030   pH 6.0 5.0 - 8.0   Glucose, UA NEGATIVE NEGATIVE mg/dL   Hgb urine dipstick LARGE (A) NEGATIVE   Bilirubin Urine SMALL (A) NEGATIVE   Ketones, ur NEGATIVE NEGATIVE mg/dL   Protein, ur 578 (A) NEGATIVE mg/dL   Nitrite NEGATIVE NEGATIVE   Leukocytes, UA NEGATIVE NEGATIVE  Urinalysis, Microscopic (reflex)     Status: Abnormal   Collection Time: 03/12/17 11:39 AM  Result Value Ref Range   RBC / HPF TOO NUMEROUS TO COUNT 0 - 5  RBC/hpf   WBC, UA 6-30 0 - 5 WBC/hpf   Bacteria, UA FEW (A) NONE SEEN   Squamous Epithelial / LPF 0-5 (A) NONE SEEN   Mucus PRESENT   Protime-INR     Status: None   Collection Time: 03/12/17 12:02 PM  Result Value Ref Range   Prothrombin Time 11.6 11.4 - 15.2 seconds   INR 0.86   CBC with Differential     Status: Abnormal   Collection Time: 03/12/17 12:45 PM  Result Value Ref Range   WBC 14.5 (H) 4.0 - 10.5 K/uL   RBC 4.97 4.22 - 5.81 MIL/uL   Hemoglobin 14.6 13.0 - 17.0 g/dL   HCT 40.9 81.1 -  91.4 %   MCV 88.9 78.0 - 100.0 fL   MCH 29.4 26.0 - 34.0 pg   MCHC 33.0 30.0 - 36.0 g/dL   RDW 78.2 95.6 - 21.3 %   Platelets 293 150 - 400 K/uL   Neutrophils Relative % 89 %   Neutro Abs 12.9 (H) 1.7 - 7.7 K/uL   Lymphocytes Relative 5 %   Lymphs Abs 0.7 0.7 - 4.0 K/uL   Monocytes Relative 6 %   Monocytes Absolute 0.8 0.1 - 1.0 K/uL   Eosinophils Relative 0 %   Eosinophils Absolute 0.0 0.0 - 0.7 K/uL   Basophils Relative 0 %   Basophils Absolute 0.0 0.0 - 0.1 K/uL  Basic metabolic panel     Status: Abnormal   Collection Time: 03/12/17 12:45 PM  Result Value Ref Range   Sodium 142 135 - 145 mmol/L   Potassium 3.9 3.5 - 5.1 mmol/L   Chloride 103 101 - 111 mmol/L   CO2 28 22 - 32 mmol/L   Glucose, Bld 128 (H) 65 - 99 mg/dL   BUN 13 6 - 20 mg/dL   Creatinine, Ser 0.86 0.61 - 1.24 mg/dL   Calcium 8.8 (L) 8.9 - 10.3 mg/dL   GFR calc non Af Amer >60 >60 mL/min   GFR calc Af Amer >60 >60 mL/min   Anion gap 11 5 - 15   No results found for this or any previous visit (from the past 240 hour(s)).  Renal Function: Recent Labs    03/12/17 1245  CREATININE 1.18   Estimated Creatinine Clearance: 88.2 mL/min (by C-G formula based on SCr of 1.18 mg/dL).  Radiologic Imaging: Ct Renal Stone Study  Result Date: 03/12/2017 CLINICAL DATA:  Left flank pain and hematuria. EXAM: CT ABDOMEN AND PELVIS WITHOUT CONTRAST TECHNIQUE: Multidetector CT imaging of the abdomen and pelvis was performed following the standard protocol without IV contrast. COMPARISON:  None. FINDINGS: Lower chest: No acute abnormality. Hepatobiliary: No focal liver abnormality is seen. No gallstones, gallbladder wall thickening, or biliary dilatation. Pancreas: Unremarkable. No pancreatic ductal dilatation or surrounding inflammatory changes. Spleen: Normal in size without focal abnormality. Adrenals/Urinary Tract: The adrenal glands are unremarkable. There is a 8 mm calculus in the right mid ureter without hydronephrosis.  There is an 12 mm calculus at the left UPJ with resultant mild hydronephrosis. The bladder is decompressed. Stomach/Bowel: Stomach is within normal limits. Appendix appears normal. No evidence of bowel wall thickening, distention, or inflammatory changes. Vascular/Lymphatic: No significant vascular findings are present. No enlarged abdominal or pelvic lymph nodes. Reproductive: Prostate is unremarkable. Other: No abdominal wall hernia or abnormality. No abdominopelvic ascites. No pneumoperitoneum. Musculoskeletal: No acute or significant osseous findings. IMPRESSION: 1. 12 mm calculus at the left UPJ with resultant mild left hydronephrosis. 2. 8 mm calculus  in the right mid ureter without hydronephrosis. Electronically Signed   By: Obie DredgeWilliam T Derry M.D.   On: 03/12/2017 13:11    I independently reviewed the above imaging studies.  Impression/Recommendation Bilateral ureteral calculi: I recommended that Colton Simon proceed with cystoscopy and bilateral ureteral stent placement to prevent him from developing acute renal failure.  Considering the size of his stones, he understands that to spontaneously pass regardless.  We reviewed the potential risks, complications, and the expected recovery process associated with this procedure.  At the plan will then be to proceed with follow-up definitive treatment with bilateral ureteroscopy with laser lithotripsy.  We have also reviewed the potential risks and complications and the expected recovery process associated with that procedure.  Kathleen Tamm,LES 03/12/2017, 4:57 PM    Colton Simon, Jr. MD   CC: Dr. Linwood DibblesJon Knapp

## 2017-03-12 NOTE — Discharge Instructions (Addendum)
1. You may see some blood in the urine and may have some burning with urination for 48-72 hours. You also may notice that you have to urinate more frequently or urgently after your procedure which is normal.  °2. You should call should you develop an inability urinate, fever > 101, persistent nausea and vomiting that prevents you from eating or drinking to stay hydrated.  °3. If you have a stent, you will likely urinate more frequently and urgently until the stent is removed and you may experience some discomfort/pain in the lower abdomen and flank especially when urinating. You may take pain medication prescribed to you if needed for pain. You may also intermittently have blood in the urine until the stent is removed. ° ° ° °General Anesthesia, Adult, Care After °These instructions provide you with information about caring for yourself after your procedure. Your health care provider may also give you more specific instructions. Your treatment has been planned according to current medical practices, but problems sometimes occur. Call your health care provider if you have any problems or questions after your procedure. °What can I expect after the procedure? °After the procedure, it is common to have: °· Vomiting. °· A sore throat. °· Mental slowness. ° °It is common to feel: °· Nauseous. °· Cold or shivery. °· Sleepy. °· Tired. °· Sore or achy, even in parts of your body where you did not have surgery. ° °Follow these instructions at home: °For at least 24 hours after the procedure: °· Do not: °? Participate in activities where you could fall or become injured. °? Drive. °? Use heavy machinery. °? Drink alcohol. °? Take sleeping pills or medicines that cause drowsiness. °? Make important decisions or sign legal documents. °? Take care of children on your own. °· Rest. °Eating and drinking °· If you vomit, drink water, juice, or soup when you can drink without vomiting. °· Drink enough fluid to keep your urine clear  or pale yellow. °· Make sure you have little or no nausea before eating solid foods. °· Follow the diet recommended by your health care provider. °General instructions °· Have a responsible adult stay with you until you are awake and alert. °· Return to your normal activities as told by your health care provider. Ask your health care provider what activities are safe for you. °· Take over-the-counter and prescription medicines only as told by your health care provider. °· If you smoke, do not smoke without supervision. °· Keep all follow-up visits as told by your health care provider. This is important. °Contact a health care provider if: °· You continue to have nausea or vomiting at home, and medicines are not helpful. °· You cannot drink fluids or start eating again. °· You cannot urinate after 8-12 hours. °· You develop a skin rash. °· You have fever. °· You have increasing redness at the site of your procedure. °Get help right away if: °· You have difficulty breathing. °· You have chest pain. °· You have unexpected bleeding. °· You feel that you are having a life-threatening or urgent problem. °This information is not intended to replace advice given to you by your health care provider. Make sure you discuss any questions you have with your health care provider. °Document Released: 04/15/2000 Document Revised: 06/12/2015 Document Reviewed: 12/22/2014 °Elsevier Interactive Patient Education © 2018 [employer] ° ° ° °

## 2017-03-12 NOTE — ED Triage Notes (Signed)
C/o lower abd pain, lower back pain started last night-states hx of kidney stone-was told too large to pass but did not follow through to have removed

## 2017-03-12 NOTE — Anesthesia Preprocedure Evaluation (Signed)
Anesthesia Evaluation  Patient identified by MRN, date of birth, ID band Patient awake    Reviewed: Allergy & Precautions, NPO status , Patient's Chart, lab work & pertinent test results  Airway Mallampati: II  TM Distance: >3 FB Neck ROM: Full    Dental no notable dental hx.    Pulmonary neg pulmonary ROS, Current Smoker,    Pulmonary exam normal breath sounds clear to auscultation       Cardiovascular negative cardio ROS Normal cardiovascular exam Rhythm:Regular Rate:Normal     Neuro/Psych negative neurological ROS  negative psych ROS   GI/Hepatic negative GI ROS, Neg liver ROS,   Endo/Other  negative endocrine ROS  Renal/GU negative Renal ROS  negative genitourinary   Musculoskeletal negative musculoskeletal ROS (+)   Abdominal   Peds negative pediatric ROS (+)  Hematology negative hematology ROS (+)   Anesthesia Other Findings   Reproductive/Obstetrics negative OB ROS                             Anesthesia Physical Anesthesia Plan  ASA: II  Anesthesia Plan: General   Post-op Pain Management:    Induction: Intravenous  PONV Risk Score and Plan: 1 and Ondansetron  Airway Management Planned: LMA  Additional Equipment:   Intra-op Plan:   Post-operative Plan: Extubation in OR  Informed Consent: I have reviewed the patients History and Physical, chart, labs and discussed the procedure including the risks, benefits and alternatives for the proposed anesthesia with the patient or authorized representative who has indicated his/her understanding and acceptance.     Dental advisory given  Plan Discussed with: CRNA  Anesthesia Plan Comments:        Anesthesia Quick Evaluation  

## 2017-03-12 NOTE — Op Note (Signed)
Preoperative diagnosis:  1. Bilateral ureteral calculi   Postoperative diagnosis:  1. Bilateral ureteral calculi   Procedure:  1. Cystoscopy 2. Bilateral ureteral stent placement (6 x 24 with no strings)  3. Bilateral retrograde pyelography with interpretation  Surgeon: Moody BruinsLester S. Tasheka Houseman, Jr. M.D.  Anesthesia: General  Complications: None  Intraoperative findings: Bilateral retrograde pyelograms were performed with 6 French ureteral catheters and Omnipaque.  On the left side, there was noted to be a filling defect in the proximal left ureter consistent with his known stone.  On the right side, there was a filling defect located in the mid right ureter consistent with his known stone.  No other abnormalities were identified.   EBL: Minimal  Specimens: None  Indication: Colton Simon is a 37 y.o. patient with bilateral ureteral calculi. After reviewing the management options for treatment, he elected to proceed with the above surgical procedure(s). We have discussed the potential benefits and risks of the procedure, side effects of the proposed treatment, the likelihood of the patient achieving the goals of the procedure, and any potential problems that might occur during the procedure or recuperation. Informed consent has been obtained.  Description of procedure:  The patient was taken to the operating room and general anesthesia was induced.  The patient was placed in the dorsal lithotomy position, prepped and draped in the usual sterile fashion, and preoperative antibiotics were administered. A preoperative time-out was performed.   Cystourethroscopy was performed.  The patient's urethra was examined and was normal. The bladder was then systematically examined in its entirety. There was no evidence for any bladder tumors, stones, or other mucosal pathology.    Attention then turned to the right ureteral orifice and a ureteral catheter was used to intubate the ureteral orifice.   Omnipaque contrast was injected through the ureteral catheter and a retrograde pyelogram was performed with findings as dictated above.  A 0.38 sensor guidewire was then advanced up the right ureter into the renal pelvis under fluoroscopic guidance.  The wire was then backloaded through the cystoscope and a ureteral stent was advance over the wire using Seldinger technique.  The stent was positioned appropriately under fluoroscopic and cystoscopic guidance.  The wire was then removed with an adequate stent curl noted in the renal pelvis as well as in the bladder.  Attention then turned to the left ureteral orifice and a ureteral catheter was used to intubate the ureteral orifice.  Omnipaque contrast was injected through the ureteral catheter and a retrograde pyelogram was performed with findings as dictated above.  A 0.38 sensor guidewire was then advanced up the left ureter into the renal pelvis under fluoroscopic guidance.  The wire was then backloaded through the cystoscope and a ureteral stent was advance over the wire using Seldinger technique.  The stent was positioned appropriately under fluoroscopic and cystoscopic guidance.  The wire was then removed with an adequate stent curl noted in the renal pelvis as well as in the bladder.  Both ureteral orifices were noted to be quite tight and ureteral stenting should allow for passive dilation with plans to perform bilateral ureteroscopy in the next few weeks for definitive treatment.  The bladder was then emptied and the procedure ended.  The patient appeared to tolerate the procedure well and without complications.  The patient was able to be awakened and transferred to the recovery unit in satisfactory condition.    Moody BruinsLester S. Colton Simon, Jr. MD

## 2017-03-12 NOTE — ED Notes (Signed)
Report given to Carelink  andcalled to Tim'RN  [facility]

## 2017-03-12 NOTE — Transfer of Care (Signed)
Immediate Anesthesia Transfer of Care Note  Patient: Colton Simon  Procedure(s) Performed: CYSTOSCOPY WITH RETROGRADE PYELOGRAM, STENT PLACEMENT (Bilateral )  Patient Location: PACU  Anesthesia Type:General  Level of Consciousness: awake and patient cooperative  Airway & Oxygen Therapy: Patient Spontanous Breathing and Patient connected to face mask oxygen  Post-op Assessment: Report given to RN and Post -op Vital signs reviewed and stable  Post vital signs: Reviewed and stable  Last Vitals:  Vitals:   03/12/17 1848 03/12/17 1849  BP:    Pulse: 73 (!) 58  Resp: 16 15  Temp:    SpO2: 100% 100%    Last Pain:  Vitals:   03/12/17 1754  TempSrc:   PainSc: 4          Complications: No apparent anesthesia complications

## 2017-03-12 NOTE — ED Notes (Signed)
Patient belongings went home with friend, Duwayne HeckDanielle. Including all cash and clothing. Cell phones are locked in safe with security.

## 2017-03-12 NOTE — Anesthesia Postprocedure Evaluation (Signed)
Anesthesia Post Note  Patient: Colton Simon  Procedure(s) Performed: CYSTOSCOPY WITH RETROGRADE PYELOGRAM, STENT PLACEMENT (Bilateral )     Patient location during evaluation: PACU Anesthesia Type: General Level of consciousness: awake and alert Pain management: pain level controlled Vital Signs Assessment: post-procedure vital signs reviewed and stable Respiratory status: spontaneous breathing, nonlabored ventilation and respiratory function stable Cardiovascular status: blood pressure returned to baseline and stable Postop Assessment: no apparent nausea or vomiting Anesthetic complications: no    Last Vitals:  Vitals:   03/12/17 1848 03/12/17 1849  BP: 128/81   Pulse: 73 (!) 58  Resp: 16 15  Temp: 36.8 C   SpO2: 100% 100%    Last Pain:  Vitals:   03/12/17 1754  TempSrc:   PainSc: 4                  Lowella CurbWarren Ray Everhett Bozard

## 2017-03-12 NOTE — ED Provider Notes (Signed)
Pt was sent from [facility] to see Dr Laverle PatterBorden, urology.  Pt has confirmed ureteral stones on CT.   Pt is comfortable at this time but feels pain is starting to come back.  Will notify Dr Laverle PatterBorden that patient is here.   Colton Simon, Colton Dimitroff, MD 03/12/17 [phone]

## 2017-03-13 ENCOUNTER — Encounter (HOSPITAL_COMMUNITY): Payer: Self-pay | Admitting: Urology

## 2017-03-18 ENCOUNTER — Other Ambulatory Visit: Payer: Self-pay | Admitting: Urology

## 2017-04-08 NOTE — Patient Instructions (Signed)
Colton Simon  04/08/2017   Your procedure is scheduled on: 04-14-17  [facility]  [facility]tr[facility]    ARRIVE AT 530 AM. Have a seat in the Main Lobby. Please note there is a phone at the [employer]. Please call [phone] on that phone. Someone from Short Stay will come and get you from the Main Lobby and take you to Short Stay.   Call this number if you have problems the morning of surgery [phone]    Remember: Do not eat food or drink liquids :After Midnight.     Take these medicines the morning of surgery with A SIP OF WATER: hydrocodone if needed                                You may not have any metal on your body including hair pins and              piercings  Do not wear jewelry, make-up, lotions, powders or perfumes, deodorant                  Men may shave face and neck.   Do not bring valuables to the hospital. [place] IS NOT             RESPONSIBLE   FOR VALUABLES.  Contacts, dentures or bridgework may not be worn into surgery.      Patients discharged the day of surgery will not be allowed to drive home.  Name and phone number of your driver:  Special Instructions: N/A              Please read over the following fact sheets you were given: _____________________________________________________________________             [facility] - Preparing for [facility] Before surgery, you can play an important role.  Because skin is not sterile, your skin needs to be as free of germs as possible.  You can reduce the number of germs on your skin by washing with CHG (chlorahexidine gluconate) soap before surgery.  CHG is an antiseptic cleaner which kills germs and bonds with the skin to continue killing germs even after washing. Please DO NOT use if you have an allergy to CHG or antibacterial soaps.  If your skin becomes reddened/irritated stop using the CHG and inform your nurse when you arrive at Short Stay. Do not  shave (including legs and underarms) for at least 48 hours prior to the first CHG shower.  You may shave your face/neck. Please follow these instructions carefully:  1.  Shower with CHG Soap the night before surgery and the  morning of Surgery.  2.  If you choose to wash your hair, wash your hair first as usual with your  normal  shampoo.  3.  After you shampoo, rinse your hair and body thoroughly to remove the  shampoo.                           4.  Use CHG as you would any other liquid soap.  You can apply chg directly  to the skin and wash                       Gently with a scrungie or clean washcloth.  5.  Apply the CHG Soap to your body ONLY FROM THE NECK DOWN.   Do not use on face/ open                           Wound or open sores. Avoid contact with eyes, ears mouth and genitals (private parts).                       Wash face,  Genitals (private parts) with your normal soap.             6.  Wash thoroughly, paying special attention to the area where your surgery  will be performed.  7.  Thoroughly rinse your body with warm water from the neck down.  8.  DO NOT shower/wash with your normal soap after using and rinsing off  the CHG Soap.                9.  Pat yourself dry with a clean towel.            10.  Wear clean pajamas.            11.  Place clean sheets on your bed the night of your first shower and do not  sleep with pets. Day of Surgery : Do not apply any lotions/deodorants the morning of surgery.  Please wear clean clothes to the hospital/surgery center.  FAILURE TO FOLLOW THESE INSTRUCTIONS MAY RESULT IN THE CANCELLATION OF YOUR SURGERY PATIENT SIGNATURE_________________________________  NURSE SIGNATURE__________________________________  ________________________________________________________________________

## 2017-04-09 ENCOUNTER — Inpatient Hospital Stay (HOSPITAL_COMMUNITY)
Admission: RE | Admit: 2017-04-09 | Discharge: 2017-04-09 | Disposition: A | Discharge status: Home / Self Care | Payer: Self-pay | Source: Ambulatory Visit

## 2017-04-11 ENCOUNTER — Other Ambulatory Visit: Payer: Self-pay

## 2017-04-11 ENCOUNTER — Other Ambulatory Visit: Payer: Self-pay | Admitting: Urology

## 2017-04-11 ENCOUNTER — Encounter (HOSPITAL_COMMUNITY): Payer: Self-pay

## 2017-04-11 ENCOUNTER — Encounter (HOSPITAL_COMMUNITY)
Admission: RE | Admit: 2017-04-11 | Discharge: 2017-04-11 | Disposition: A | Discharge status: Home / Self Care | Payer: Self-pay | Source: Ambulatory Visit | Attending: Urology | Admitting: Urology

## 2017-04-11 DIAGNOSIS — Z01812 Encounter for preprocedural laboratory examination: Secondary | ICD-10-CM | POA: Insufficient documentation

## 2017-04-11 HISTORY — DX: Headache: R51

## 2017-04-11 HISTORY — DX: Personal history of urinary calculi: Z87.442

## 2017-04-11 HISTORY — DX: Headache, unspecified: R51.9

## 2017-04-11 LAB — CBC
HCT: 42.9 % (ref 39.0–52.0)
HEMOGLOBIN: 13.6 g/dL (ref 13.0–17.0)
MCH: 29.2 pg (ref 26.0–34.0)
MCHC: 31.7 g/dL (ref 30.0–36.0)
MCV: 92.3 fL (ref 78.0–100.0)
Platelets: 347 10*3/uL (ref 150–400)
RBC: 4.65 MIL/uL (ref 4.22–5.81)
RDW: 13.1 % (ref 11.5–15.5)
WBC: 10 10*3/uL (ref 4.0–10.5)

## 2017-04-11 LAB — RAPID URINE DRUG SCREEN, HOSP PERFORMED
Amphetamines: NOT DETECTED
BARBITURATES: NOT DETECTED
Benzodiazepines: NOT DETECTED
Cocaine: POSITIVE — AB
OPIATES: NOT DETECTED
TETRAHYDROCANNABINOL: POSITIVE — AB

## 2017-04-11 NOTE — Patient Instructions (Signed)
Colton MessickMontrell Simon  04/11/2017   Your procedure is scheduled on: Monday 04/14/2017  Report to [facility] Main  Entrance   Report to admitting at   0700 AM    Call this number if you have problems the morning of surgery [phone]    Remember: Do not eat food or drink liquids :After Midnight.     Take these medicines the morning of surgery with A SIP OF WATER: oxycodone-acetaminophen if needed                                You may not have any metal on your body including hair pins and              piercings  Do not wear jewelry, make-up, lotions, powders or perfumes, deodorant             Do not wear nail polish.  Do not shave  48 hours prior to surgery.              Men may shave face and neck.   Do not bring valuables to the hospital. [place] IS NOT             RESPONSIBLE   FOR VALUABLES.  Contacts, dentures or bridgework may not be worn into surgery.  Leave suitcase in the car. After surgery it may be brought to your room.     Patients discharged the day of surgery will not be allowed to drive home.  Name and phone number of your driver:girlfriend- [place]                Please read over the following fact sheets you were given: _____________________________________________________________________             [facility] - Preparing for Surgery Before surgery, you can play an important role.  Because skin is not sterile, your skin needs to be as free of germs as possible.  You can reduce the number of germs on your skin by washing with CHG (chlorahexidine gluconate) soap before surgery.  CHG is an antiseptic cleaner which kills germs and bonds with the skin to continue killing germs even after washing. Please DO NOT use if you have an allergy to CHG or antibacterial soaps.  If your skin becomes reddened/irritated stop using the CHG and inform your nurse when you arrive at Short Stay. Do not shave (including legs and underarms) for at least 48  hours prior to the first CHG shower.  You may shave your face/neck. Please follow these instructions carefully:  1.  Shower with CHG Soap the night before surgery and the  morning of Surgery.  2.  If you choose to wash your hair, wash your hair first as usual with your  normal  shampoo.  3.  After you shampoo, rinse your hair and body thoroughly to remove the  shampoo.                           4.  Use CHG as you would any other liquid soap.  You can apply chg directly  to the skin and wash                       Gently with a scrungie or clean washcloth.  5.  Apply the CHG Soap to your body ONLY FROM THE NECK DOWN.   Do not use on face/ open                           Wound or open sores. Avoid contact with eyes, ears mouth and genitals (private parts).                       Wash face,  Genitals (private parts) with your normal soap.             6.  Wash thoroughly, paying special attention to the area where your surgery  will be performed.  7.  Thoroughly rinse your body with warm water from the neck down.  8.  DO NOT shower/wash with your normal soap after using and rinsing off  the CHG Soap.                9.  Pat yourself dry with a clean towel.            10.  Wear clean pajamas.            11.  Place clean sheets on your bed the night of your first shower and do not  sleep with pets. Day of Surgery : Do not apply any lotions/deodorants the morning of surgery.  Please wear clean clothes to the hospital/surgery center.  FAILURE TO FOLLOW THESE INSTRUCTIONS MAY RESULT IN THE CANCELLATION OF YOUR SURGERY PATIENT SIGNATURE_________________________________  NURSE SIGNATURE__________________________________  ________________________________________________________________________

## 2017-04-11 NOTE — H&P (Signed)
Office Visit Report     04/03/2017   --------------------------------------------------------------------------------   Colton Simon  MRN: [id number]  PRIMARY CARE:    DOB: 1980/05/22, 37 year old Male  REFERRING:    SSN:   PROVIDER:  Heloise Purpura, M.D.    TREATING:  Jetta Lout    LOCATION:  [facility] [phone]   --------------------------------------------------------------------------------   CC/HPI: Seen today as a work in for (B) flank pain, insomina, and anxiety. States he is unable to "live his life in this much pain. Tramadol and Hydrocodone are not controlling it. I'm just polluting my body with NSAIDs and they don't work. I need for you to give me something stronger! I need some meds to help me sleep and my anxiety!" Denies f/c or n/v.     ALLERGIES: None   MEDICATIONS: None   GU PSH: Cystoscopy Insert Stent, Bilateral - 03/12/2017    NON-GU PSH: None   GU PMH: Flank Pain (Stable, Chronic), Bilateral, Hydrocodone 5/325 mg 1 po Q6 hrs prn #20/0RF. May also alternate NSAID for pain copntrol. - 03/18/2017 Ureteral calculus    NON-GU PMH: Asthma    FAMILY HISTORY: 3 Son's - Runs in Family Cancer - Grandfather Heart Attack - Grandfather stroke - Grandfather   SOCIAL HISTORY: Marital Status: Single Preferred Language: English; Ethnicity: Not Hispanic Or Latino; Race: Black or African American Current Smoking Status: Patient smokes. Has smoked since 02/22/2004. Smokes 1/2 pack per day.   Tobacco Use Assessment Completed: Used Tobacco in last 30 days? Uses smokeless tobacco. Drinks 6 drinks per week. Types of alcohol consumed: Liquor.  Patient uses recreational drugs. Uses marijuana. Drinks 1 caffeinated drink per day. Has not had a blood transfusion.     Notes: 3 joints/day   REVIEW OF SYSTEMS:    GU Review Male:   Patient reports frequent urination, hard to postpone urination, burning/ pain with urination, get up at night to urinate,  leakage of urine, stream starts and stops, and have to strain to urinate . Patient denies trouble starting your stream, erection problems, and penile pain.  Gastrointestinal (Upper):   Patient denies nausea, vomiting, and indigestion/ heartburn.  Gastrointestinal (Lower):   Patient reports constipation. Patient denies diarrhea.  Constitutional:   Patient reports weight loss and fatigue. Patient denies fever and night sweats.  Skin:   Patient denies skin rash/ lesion and itching.  Eyes:   Patient reports blurred vision. Patient denies double vision.  Ears/ Nose/ Throat:   Patient reports sinus problems. Patient denies sore throat.  Hematologic/Lymphatic:   Patient denies swollen glands and easy bruising.  Cardiovascular:   Patient denies leg swelling and chest pains.  Respiratory:   Patient denies cough and shortness of breath.  Endocrine:   Patient reports excessive thirst.   Musculoskeletal:   Patient reports back pain. Patient denies joint pain.  Neurological:   Patient denies headaches and dizziness.  Psychologic:   Patient reports depression and anxiety.    VITAL SIGNS:      04/03/2017 01:56 PM  Weight 153 lb / 69.4 kg  Height 70 in / 177.8 cm  BP 108/67 mmHg  Pulse 75 /min  Temperature 98.2 F / 36.7 C  BMI 22.0 kg/m   MULTI-SYSTEM PHYSICAL EXAMINATION:    Constitutional: Thin. No physical deformities. Normally developed. Good grooming.   Respiratory: No labored breathing, no use of accessory muscles. Normal breath sounds.   Cardiovascular: Regular rate and rhythm. No murmur, no gallop. Normal temperature, normal extremity  pulses, no swelling, no varicosities.   Skin: No paleness, no jaundice, no cyanosis. No lesion, no ulcer, no rash.   Neurologic / Psychiatric: Oriented to time, oriented to place, oriented to person. No depression, no anxiety, no agitation.   Gastrointestinal: No mass, no tenderness, no rigidity, non obese abdomen. No CVAT  Musculoskeletal: Normal gait and  station of head and neck.     PAST DATA REVIEWED:  Source Of History:  Patient  Records Review:   Previous Patient Records  Urine Test Review:   Urinalysis  X-Ray Review: KUB: Reviewed Films.     PROCEDURES:          Urinalysis w/Scope - 81001 Dipstick Dipstick Cont'd Micro  Specimen: Voided Bilirubin: Invalid WBC/hpf: 6 - 10/hpf  Color: Manson PasseyBrown Ketones: Invalid RBC/hpf: >60/hpf  Appearance: Turbid Blood: Invalid Bacteria: Few (10-25/hpf)  Specific Gravity: >1.030 Protein: Invalid Cystals: NS (Not Seen)  pH: Invalid Urobilinogen: Invalid Casts: NS (Not Seen)  Glucose: Invalid Nitrites: Invalid Trichomonas: Not Present    Leukocyte Esterase: Invalid Mucous: Not Present      Epithelial Cells: NS (Not Seen)      Yeast: NS (Not Seen)      Sperm: Not Present    Notes: unspun micro    ASSESSMENT:      ICD-10 Details  1 GU:   Flank Pain - R10.84 Bilateral, Worsening, Chronic - Culture urine. No ABX unless culture proven UTI. Will send urine for urine drug screen with known hx of daily marijuana use. Will change pain med to Oxycodone 5/325 mg 1 po Q8 hrs prn. Recommend he see PCP about sleep/anxiety issues. Recommend he try OTC sleep meds.    PLAN:            Medications New Meds: Oxycodone-Acetaminophen 5 mg-325 mg tablet 1 tablet PO Q 8 H PRN   #15  0 Refill(s)    Stop Meds: Hydrocodone-Acetaminophen 5 mg-325 mg tablet 1 tablet PO Q 6 H PRN  Start: 03/18/2017  Discontinue: 04/03/2017  - Reason: The medication was ineffective.            Orders Labs Drug Abuse Screen-5 (Serum) [place], Urine Culture  Lab Notes: urine drug screen.           Document Letter(s):  Created for Patient: Clinical Summary         Next Appointment:      Next Appointment: 04/14/2017 07:30 AM    Appointment Type: Surgery     Location: [facility] [phone]    Provider: Heloise PurpuraLester Debbora Ang, M.D.    Reason for Visit: WL/OP CUSTO BL UR LL BL UR STENT PLACEMENT      CARE TEAM: Ernesto RutherfordDiane  Warden Shanita Tillman     * Signed by Jetta Loutiane Warden on 04/03/17 at 2:21 PM (EDT)*

## 2017-04-14 ENCOUNTER — Ambulatory Visit (HOSPITAL_COMMUNITY)
Admission: RE | Admit: 2017-04-14 | Discharge: 2017-04-14 | Disposition: A | Discharge status: Home / Self Care | Payer: Self-pay | Source: Ambulatory Visit | Attending: Urology | Admitting: Urology

## 2017-04-14 ENCOUNTER — Encounter (HOSPITAL_COMMUNITY)
Admission: RE | Disposition: A | Discharge status: Home / Self Care | Payer: Self-pay | Source: Ambulatory Visit | Attending: Urology

## 2017-04-14 ENCOUNTER — Ambulatory Visit (HOSPITAL_COMMUNITY): Payer: Self-pay

## 2017-04-14 ENCOUNTER — Ambulatory Visit (HOSPITAL_COMMUNITY): Payer: Self-pay | Admitting: Anesthesiology

## 2017-04-14 ENCOUNTER — Encounter (HOSPITAL_COMMUNITY): Payer: Self-pay | Admitting: Certified Registered Nurse Anesthetist

## 2017-04-14 DIAGNOSIS — F17210 Nicotine dependence, cigarettes, uncomplicated: Secondary | ICD-10-CM | POA: Insufficient documentation

## 2017-04-14 DIAGNOSIS — N201 Calculus of ureter: Secondary | ICD-10-CM | POA: Insufficient documentation

## 2017-04-14 HISTORY — PX: CYSTOSCOPY/URETEROSCOPY/HOLMIUM LASER/STENT PLACEMENT: SHX6546

## 2017-04-14 LAB — RAPID URINE DRUG SCREEN, HOSP PERFORMED
Amphetamines: NOT DETECTED
BENZODIAZEPINES: NOT DETECTED
Barbiturates: NOT DETECTED
COCAINE: NOT DETECTED
OPIATES: NOT DETECTED
Tetrahydrocannabinol: POSITIVE — AB

## 2017-04-14 SURGERY — CYSTOSCOPY/URETEROSCOPY/HOLMIUM LASER/STENT PLACEMENT
Anesthesia: General | Laterality: Bilateral

## 2017-04-14 MED ORDER — IOHEXOL 300 MG/ML  SOLN
INTRAMUSCULAR | Status: DC | PRN
  Administered 2017-04-14: 8 mL

## 2017-04-14 MED ORDER — SULFAMETHOXAZOLE-TRIMETHOPRIM 800-160 MG PO TABS: 1.0000 | ORAL_TABLET | Freq: Two times a day (BID) | ORAL | 0 refills | Status: AC

## 2017-04-14 MED ORDER — MIDAZOLAM HCL 2 MG/2ML IJ SOLN
INTRAMUSCULAR | Status: AC
  Filled 2017-04-14: qty 4

## 2017-04-14 MED ORDER — LIDOCAINE 2% (20 MG/ML) 5 ML SYRINGE
INTRAMUSCULAR | Status: DC | PRN
  Administered 2017-04-14: 30 mg via INTRAVENOUS

## 2017-04-14 MED ORDER — FENTANYL CITRATE (PF) 100 MCG/2ML IJ SOLN
INTRAMUSCULAR | Status: AC
  Filled 2017-04-14: qty 2

## 2017-04-14 MED ORDER — FENTANYL CITRATE (PF) 100 MCG/2ML IJ SOLN
25.0000 ug | INTRAMUSCULAR | Status: DC | PRN
  Administered 2017-04-14: 25 ug via INTRAVENOUS

## 2017-04-14 MED ORDER — PROPOFOL 10 MG/ML IV BOLUS
INTRAVENOUS | Status: AC
  Filled 2017-04-14: qty 40

## 2017-04-14 MED ORDER — MIDAZOLAM HCL 5 MG/5ML IJ SOLN
INTRAMUSCULAR | Status: DC | PRN
  Administered 2017-04-14: 4 mg via INTRAVENOUS

## 2017-04-14 MED ORDER — LIDOCAINE 2% (20 MG/ML) 5 ML SYRINGE
INTRAMUSCULAR | Status: AC
  Filled 2017-04-14: qty 5

## 2017-04-14 MED ORDER — PROMETHAZINE HCL 25 MG/ML IJ SOLN: 6.2500 mg | INTRAMUSCULAR | Status: DC | PRN

## 2017-04-14 MED ORDER — PHENAZOPYRIDINE HCL 100 MG PO TABS: 100.0000 mg | ORAL_TABLET | Freq: Three times a day (TID) | ORAL | 0 refills | Status: AC | PRN

## 2017-04-14 MED ORDER — HYDROCODONE-ACETAMINOPHEN 5-325 MG PO TABS: 1.0000 | ORAL_TABLET | Freq: Four times a day (QID) | ORAL | 0 refills | Status: AC | PRN

## 2017-04-14 MED ORDER — CEFAZOLIN SODIUM-DEXTROSE 2-4 GM/100ML-% IV SOLN
2.0000 g | Freq: Once | INTRAVENOUS | Status: AC
  Administered 2017-04-14: 2 g via INTRAVENOUS
  Filled 2017-04-14: qty 100

## 2017-04-14 MED ORDER — KETOROLAC TROMETHAMINE 30 MG/ML IJ SOLN
INTRAMUSCULAR | Status: AC
  Filled 2017-04-14: qty 1

## 2017-04-14 MED ORDER — DEXAMETHASONE SODIUM PHOSPHATE 10 MG/ML IJ SOLN
INTRAMUSCULAR | Status: AC
  Filled 2017-04-14: qty 1

## 2017-04-14 MED ORDER — SODIUM CHLORIDE 0.9 % IR SOLN
Status: DC | PRN
  Administered 2017-04-14: 6000 mL

## 2017-04-14 MED ORDER — ONDANSETRON HCL 4 MG/2ML IJ SOLN
INTRAMUSCULAR | Status: DC | PRN
  Administered 2017-04-14: 4 mg via INTRAVENOUS

## 2017-04-14 MED ORDER — LACTATED RINGERS IV SOLN
INTRAVENOUS | Status: DC
  Administered 2017-04-14: 1000 mL via INTRAVENOUS

## 2017-04-14 MED ORDER — PROPOFOL 10 MG/ML IV BOLUS
INTRAVENOUS | Status: DC | PRN
  Administered 2017-04-14: 150 mg via INTRAVENOUS

## 2017-04-14 MED ORDER — DEXAMETHASONE SODIUM PHOSPHATE 10 MG/ML IJ SOLN
INTRAMUSCULAR | Status: DC | PRN
  Administered 2017-04-14: 10 mg via INTRAVENOUS

## 2017-04-14 MED ORDER — KETOROLAC TROMETHAMINE 30 MG/ML IJ SOLN
INTRAMUSCULAR | Status: DC | PRN
  Administered 2017-04-14: 30 mg via INTRAVENOUS

## 2017-04-14 MED ORDER — FENTANYL CITRATE (PF) 100 MCG/2ML IJ SOLN
INTRAMUSCULAR | Status: DC | PRN
  Administered 2017-04-14 (×2): 50 ug via INTRAVENOUS

## 2017-04-14 MED ORDER — ONDANSETRON HCL 4 MG/2ML IJ SOLN
INTRAMUSCULAR | Status: AC
  Filled 2017-04-14: qty 2

## 2017-04-14 SURGICAL SUPPLY — 21 items
BAG URO CATCHER STRL LF (MISCELLANEOUS) ×3 IMPLANT
BASKET ZERO TIP NITINOL 2.4FR (BASKET) ×3 IMPLANT
CATH INTERMIT  6FR 70CM (CATHETERS) ×3 IMPLANT
CLOTH BEACON ORANGE TIMEOUT ST (SAFETY) ×3 IMPLANT
COVER FOOTSWITCH UNIV (MISCELLANEOUS) IMPLANT
COVER SURGICAL LIGHT HANDLE (MISCELLANEOUS) IMPLANT
FIBER LASER FLEXIVA 365 (UROLOGICAL SUPPLIES) IMPLANT
FIBER LASER TRAC TIP (UROLOGICAL SUPPLIES) ×3 IMPLANT
GLOVE BIOGEL M STRL SZ7.5 (GLOVE) ×3 IMPLANT
GOWN STRL REUS W/TWL LRG LVL3 (GOWN DISPOSABLE) ×3 IMPLANT
GUIDEWIRE ANG ZIPWIRE 038X150 (WIRE) IMPLANT
GUIDEWIRE STR DUAL SENSOR (WIRE) ×3 IMPLANT
IV NS 1000ML (IV SOLUTION)
IV NS 1000ML BAXH (IV SOLUTION) IMPLANT
MANIFOLD NEPTUNE II (INSTRUMENTS) ×3 IMPLANT
PACK CYSTO (CUSTOM PROCEDURE TRAY) ×3 IMPLANT
SHEATH URETERAL 12FRX35CM (MISCELLANEOUS) IMPLANT
STENT URET 6FRX24 CONTOUR (STENTS) ×3 IMPLANT
TUBING CONNECTING 10 (TUBING) ×2 IMPLANT
TUBING CONNECTING 10' (TUBING) ×1
TUBING UROLOGY SET (TUBING) ×3 IMPLANT

## 2017-04-14 NOTE — Anesthesia Preprocedure Evaluation (Addendum)
Anesthesia Evaluation  Patient identified by MRN, date of birth, ID band Patient awake    Reviewed: Allergy & Precautions, NPO status , Patient's Chart, lab work & pertinent test results  Airway Mallampati: II  TM Distance: >3 FB Neck ROM: Full    Dental no notable dental hx.    Pulmonary Current Smoker,    Pulmonary exam normal breath sounds clear to auscultation       Cardiovascular negative cardio ROS Normal cardiovascular exam Rhythm:Regular Rate:Normal     Neuro/Psych negative neurological ROS  negative psych ROS   GI/Hepatic negative GI ROS, (+)     substance abuse  cocaine use,   Endo/Other  negative endocrine ROS  Renal/GU negative Renal ROS  negative genitourinary   Musculoskeletal negative musculoskeletal ROS (+)   Abdominal   Peds negative pediatric ROS (+)  Hematology negative hematology ROS (+)   Anesthesia Other Findings   Reproductive/Obstetrics negative OB ROS                            Anesthesia Physical Anesthesia Plan  ASA: III  Anesthesia Plan: General   Post-op Pain Management:    Induction: Intravenous  PONV Risk Score and Plan: 2 and Ondansetron and Dexamethasone  Airway Management Planned: LMA  Additional Equipment:   Intra-op Plan:   Post-operative Plan: Extubation in OR  Informed Consent: I have reviewed the patients History and Physical, chart, labs and discussed the procedure including the risks, benefits and alternatives for the proposed anesthesia with the patient or authorized representative who has indicated his/her understanding and acceptance.   Dental advisory given  Plan Discussed with: CRNA and Surgeon  Anesthesia Plan Comments:        Anesthesia Quick Evaluation

## 2017-04-14 NOTE — Anesthesia Procedure Notes (Signed)
Procedure Name: LMA Insertion Date/Time: 04/14/2017 9:08 AM Performed by: [place], Dariona Postma C, CRNA Pre-anesthesia Checklist: Patient identified, Emergency Drugs available, Suction available and Patient being monitored Patient Re-evaluated:Patient Re-evaluated prior to induction Oxygen Delivery Method: Circle system utilized Preoxygenation: Pre-oxygenation with 100% oxygen Induction Type: IV induction Ventilation: Mask ventilation without difficulty LMA: LMA inserted LMA Size: 4.0 Number of attempts: 1 Airway Equipment and Method: Bite block Placement Confirmation: positive ETCO2 Tube secured with: Tape Dental Injury: Teeth and Oropharynx as per pre-operative assessment

## 2017-04-14 NOTE — Discharge Instructions (Signed)
1. You may see some blood in the urine and may have some burning with urination for 48-72 hours. You also may notice that you have to urinate more frequently or urgently after your procedure which is normal.  °2. You should call should you develop an inability urinate, fever > 101, persistent nausea and vomiting that prevents you from eating or drinking to stay hydrated.  °3. If you have a stent, you will likely urinate more frequently and urgently until the stent is removed and you may experience some discomfort/pain in the lower abdomen and flank especially when urinating. You may take pain medication prescribed to you if needed for pain. You may also intermittently have blood in the urine until the stent is removed. °4.   You may remove your stent on Friday morning.  Simply pull the string that is taped to your body and the stent will easily come out.  This may be best done in the shower as some urine may come out with the stent.  Usually you will feel relief once the stent is removed, but occasionally patients can develop pain due to residual swelling of the ureter that may temporarily obstruct the kidney.  This can be managed by taking pain medication and it will typically resolve with time.  Please do not hesitate to call if you have pain that is not controlled with your pain medication or does not improved within 24-48 hours. ° °

## 2017-04-14 NOTE — Anesthesia Postprocedure Evaluation (Signed)
Anesthesia Post Note  Patient: Denorris Twaddell  Procedure(s) Performed: CYSTOSCOPY/URETEROSCOPY/HOLMIUM LASER/ LEFT STENT PLACEMENT (Bilateral )     Patient location during evaluation: PACU Anesthesia Type: General Level of consciousness: awake and alert Pain management: pain level controlled Vital Signs Assessment: post-procedure vital signs reviewed and stable Respiratory status: spontaneous breathing, nonlabored ventilation, respiratory function stable and patient connected to nasal cannula oxygen Cardiovascular status: blood pressure returned to baseline and stable Postop Assessment: no apparent nausea or vomiting Anesthetic complications: no    Last Vitals:  Vitals:   04/14/17 1109 04/14/17 1140  BP: 123/85 112/81  Pulse: 72 72  Resp: 16 16  Temp: 36.9 C 36.9 C  SpO2: 98% 99%    Last Pain:  Vitals:   04/14/17 1140  TempSrc:   PainSc: 3                  Aron Inge S

## 2017-04-14 NOTE — Interval H&P Note (Signed)
History and Physical Interval Note:  04/14/2017 8:44 AM  Colton Simon  has presented today for surgery, with the diagnosis of BILATERAL URETERAL CALCULI  The various methods of treatment have been discussed with the patient and family. Drug screen has been reviewed. After consideration of risks, benefits and other options for treatment, the patient has consented to  Procedure(s): CYSTOSCOPY/URETEROSCOPY/HOLMIUM LASER/STENT PLACEMENT (Bilateral) as a surgical intervention .  The patient's history has been reviewed, patient examined, no change in status, stable for surgery.  I have reviewed the patient's chart and labs.  Questions were answered to the patient's satisfaction.     Jaidah Lomax,LES

## 2017-04-14 NOTE — OR Nursing (Signed)
Stone taken per Dr. Borden 

## 2017-04-14 NOTE — Op Note (Signed)
Preoperative diagnosis: Bilateral ureteral calculi  Postoperative diagnosis: Bilateral ureteral calculi  Procedure:  1. Cystoscopy 2. Bilateral ureteroscopy and stone removal 3. Ureteroscopic laser lithotripsy 4. Left ureteral stent placement (6 x 24 with string tether) 5. Left retrograde pyelography with interpretation  Surgeon: Moody BruinsLester S. Camella Seim, Jr. M.D.  Anesthesia: General  Complications: None  Intraoperative findings: Left retrograde pyelography demonstrated a filling defect within the proximal left ureter consistent with the patient's known calculus without other abnormalities.  EBL: Minimal  Specimens: 1. Bilateral ureteral calculi  Disposition of specimens: [facility] for stone analysis  Indication: Colton Simon is a 37 y.o. year old patient with urolithiasis who presented with bilateral ureteral stones and underwent stent placement bilaterally.  He presents today for definitive stone treatment. After reviewing the management options for treatment, the patient elected to proceed with the above surgical procedure(s). We have discussed the potential benefits and risks of the procedure, side effects of the proposed treatment, the likelihood of the patient achieving the goals of the procedure, and any potential problems that might occur during the procedure or recuperation. Informed consent has been obtained.  Description of procedure:  The patient was taken to the operating room and general anesthesia was induced.  The patient was placed in the dorsal lithotomy position, prepped and draped in the usual sterile fashion, and preoperative antibiotics were administered. A preoperative time-out was performed.   Cystourethroscopy was performed.  The patient's urethra was examined and was normal. The bladder was then systematically examined in its entirety. There was no evidence for any bladder tumors, stones, or other mucosal pathology.    Attention then turned  to the right ureteral orifice and a ureteral catheter and the indwelling stent was removed with a flexible grasper.  A 0.38 sensor guidewire was then advanced up the right ureter into the renal pelvis under fluoroscopic guidance. The 6 Fr semirigid ureteroscope was then advanced into the ureter next to the guidewire and the calculus was identified in the mid right ureter.   The stone was then fragmented with the 200 micron holmium laser fiber on a setting of 0.6 J and frequency of 6 Hz.   All stones were then removed from the ureter with a zero tip nitinol basket.  Reinspection of the ureter revealed no remaining visible stones or fragments.   This procedure was very non-traumatic and it was not felt that a stent needed to be replaced.   Attention then turned to the left ureteral orifice and the left ureteral stent was brought out to the urethral meatus.    A 0.38 sensor guidewire was then advanced up the left ureter into the renal pelvis under fluoroscopic guidance.  A retrograde pyelogram was performed with Omnipaque contrast and revealed a filling defect at the UPJ without other abnormalities.  A 12/14 Fr ureteral access sheath was then advanced over the guide wire to a point below the stone. The digital flexible ureteroscope was then advanced through the access sheath into the ureter next to the guidewire and the calculus was identified and was located in the proximal left ureter.   The stone was then fragmented with the 200 micron holmium laser fiber on a setting of 0.6 J and frequency of 6 Hz.   All sizable stones were then removed with a zero tip nitinol basket.  Reinspection of the ureter/renal pelvis revealed no remaining visible stones or fragments of significant size.   The safety wire was then replaced and the access sheath removed.  The guidewire was backloaded through the cystoscope and a ureteral stent was advance over the wire using Seldinger technique.  The stent was positioned  appropriately under fluoroscopic and cystoscopic guidance.  The wire was then removed with an adequate stent curl noted in the renal pelvis as well as in the bladder.  The bladder was then emptied and the procedure ended.  The patient appeared to tolerate the procedure well and without complications.  The patient was able to be awakened and transferred to the recovery unit in satisfactory condition.   Moody Bruins MD

## 2017-04-14 NOTE — Transfer of Care (Signed)
Immediate Anesthesia Transfer of Care Note  Patient: Colton Simon  Procedure(s) Performed: CYSTOSCOPY/URETEROSCOPY/HOLMIUM LASER/ LEFT STENT PLACEMENT (Bilateral )  Patient Location: PACU  Anesthesia Type:General  Level of Consciousness: awake, alert  and oriented  Airway & Oxygen Therapy: Patient Spontanous Breathing and Patient connected to face mask oxygen  Post-op Assessment: Report given to RN and Post -op Vital signs reviewed and stable  Post vital signs: Reviewed and stable  Last Vitals:  Vitals Value Taken Time  BP 114/85 04/14/2017 10:19 AM  Temp    Pulse 64 04/14/2017 10:21 AM  Resp 16 04/14/2017 10:21 AM  SpO2 100 % 04/14/2017 10:21 AM  Vitals shown include unvalidated device data.  Last Pain:  Vitals:   04/14/17 0758  TempSrc: Oral      Patients Stated Pain Goal: 3 (04/14/17 [id number])  Complications: No apparent anesthesia complications

## 2017-04-15 ENCOUNTER — Encounter (HOSPITAL_COMMUNITY): Payer: Self-pay | Admitting: Urology

## 2018-08-06 IMAGING — CT CT RENAL STONE PROTOCOL
2 of 4 series · 16 of 46 positions shown, 18 images · non-contrast
Comparison: None.

CLINICAL DATA: Left flank pain and hematuria.

EXAM:
CT ABDOMEN AND PELVIS WITHOUT CONTRAST
TECHNIQUE: Multidetector CT imaging of the abdomen and pelvis was performed
following the standard protocol without IV contrast.

[Series 2: axial st · axial · 0.76mm/px · z∈[-420,-20]mm · 13 of 88 slices shown, 15 images]
[im 4/88  soft-tissue]
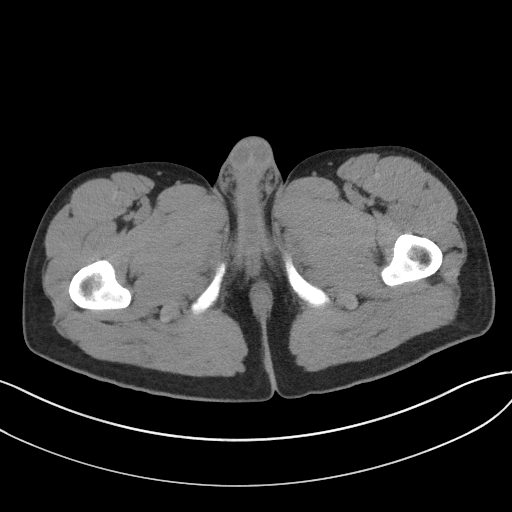
[im 4/88  bone]
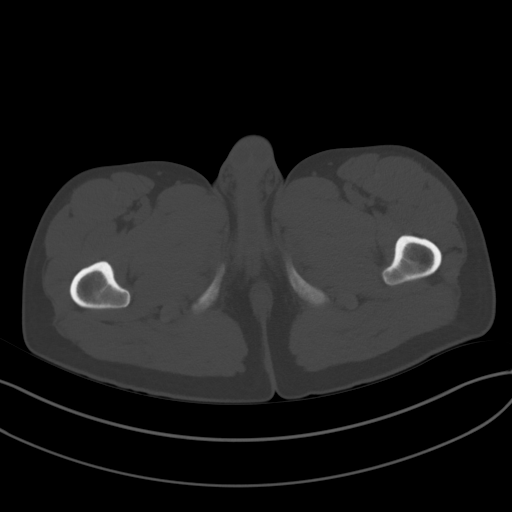
[im 12/88  soft-tissue]
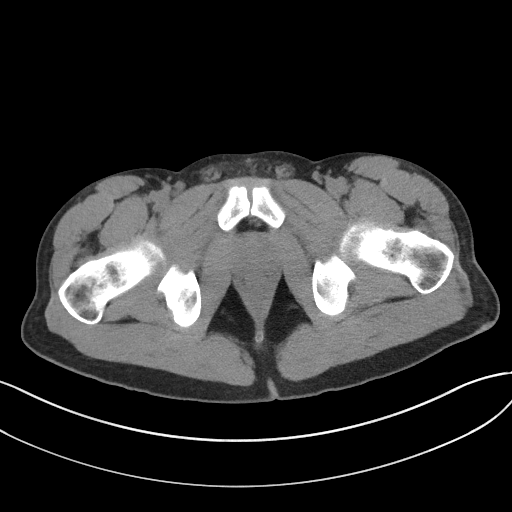
[im 20/88  soft-tissue]
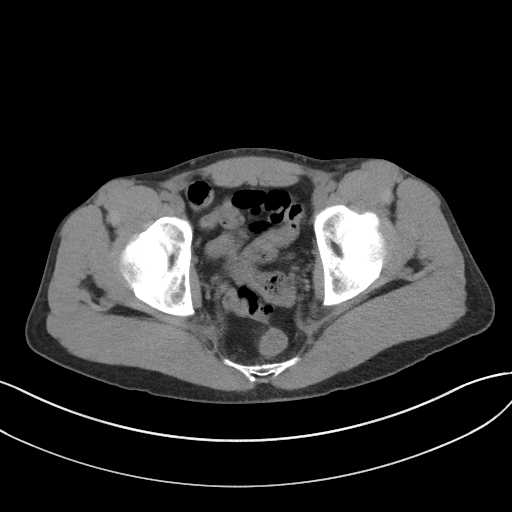
[im 24/88  soft-tissue]
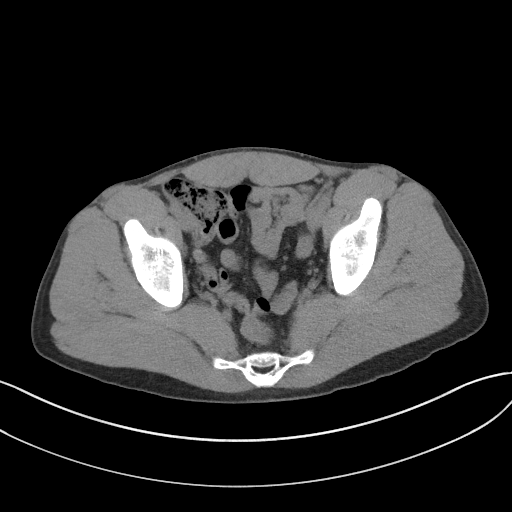
[im 32/88  soft-tissue]
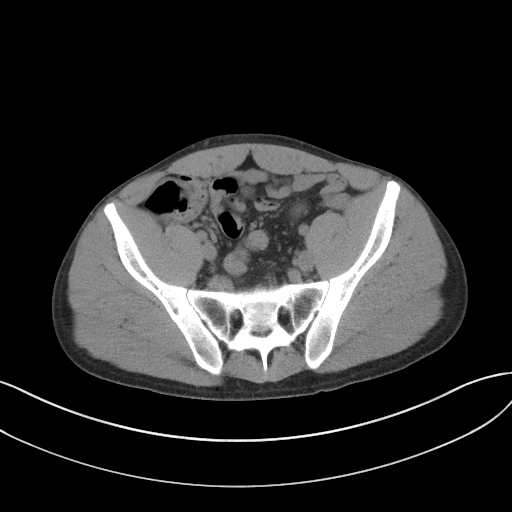
[im 36/88  soft-tissue]
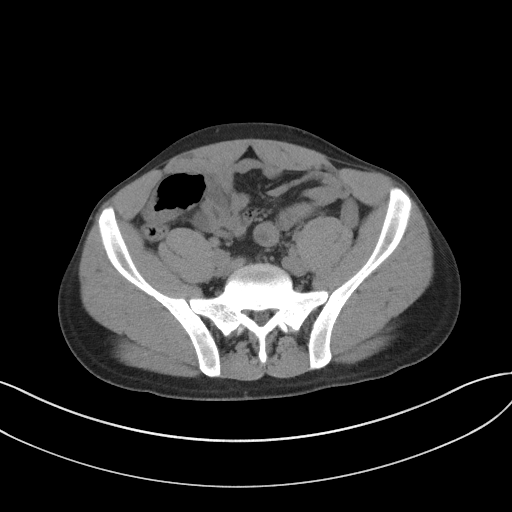
[im 44/88  soft-tissue]
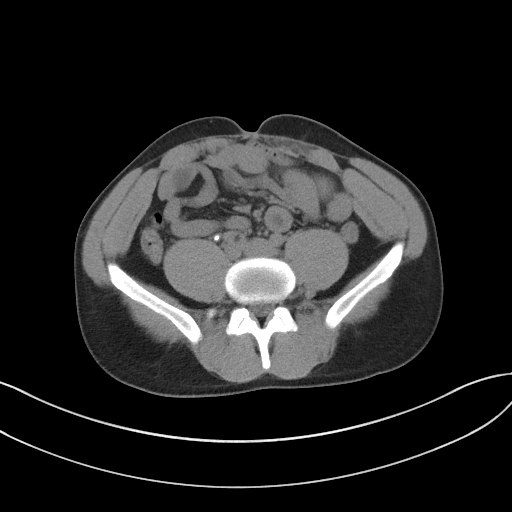
[im 52/88  soft-tissue]
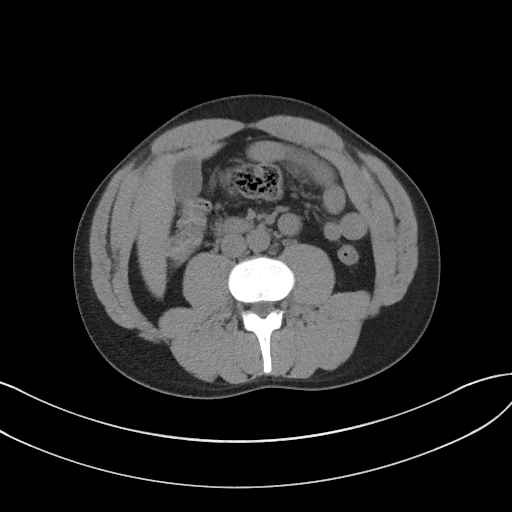
[im 56/88  soft-tissue]
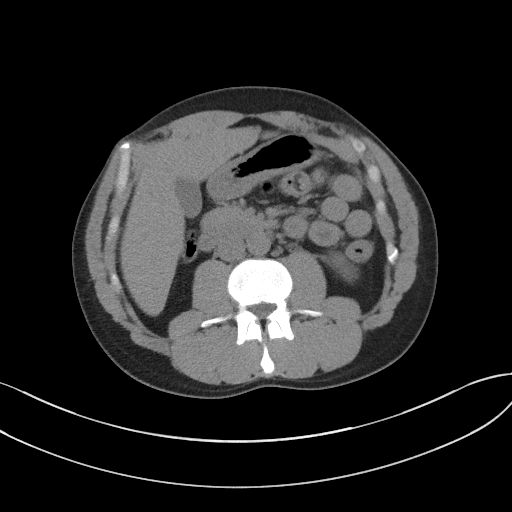
[im 56/88  bone]
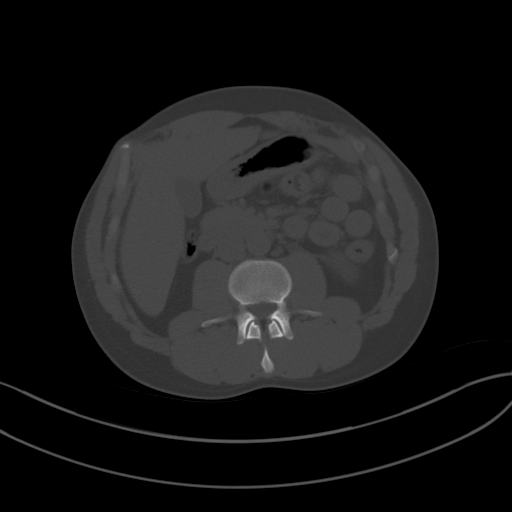
[im 64/88  soft-tissue]
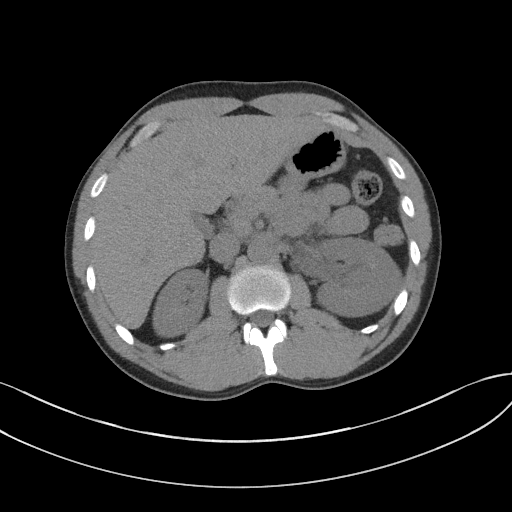
[im 68/88  soft-tissue]
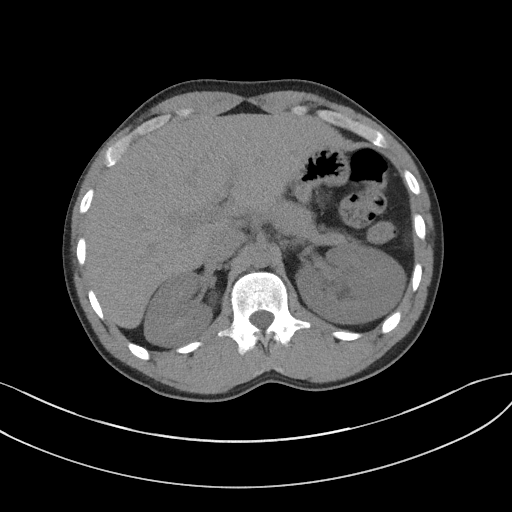
[im 76/88  soft-tissue]
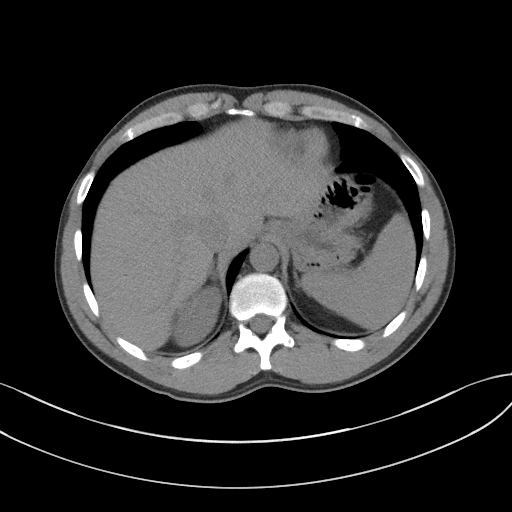
[im 84/88  soft-tissue]
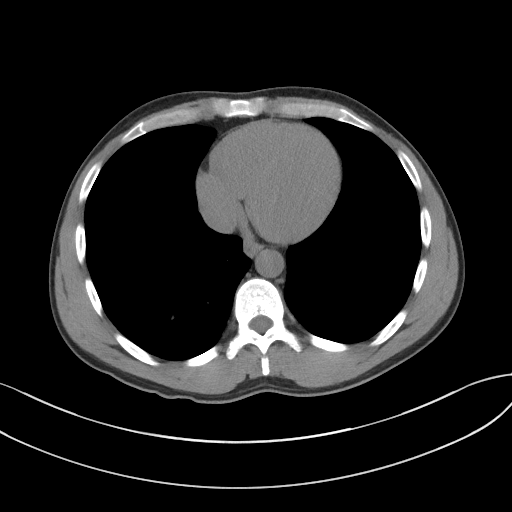

[Series 5: coronal st · coronal · 0.68mm/px · 3 of 82 slices shown]
[im 28/82  soft-tissue]
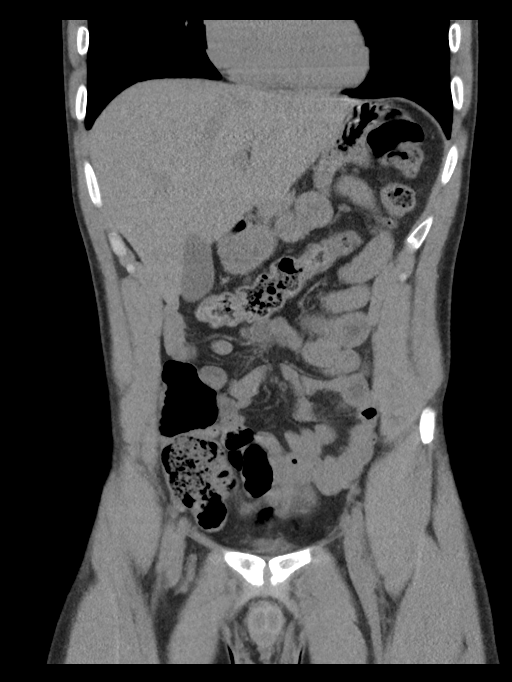
[im 37/82  soft-tissue]
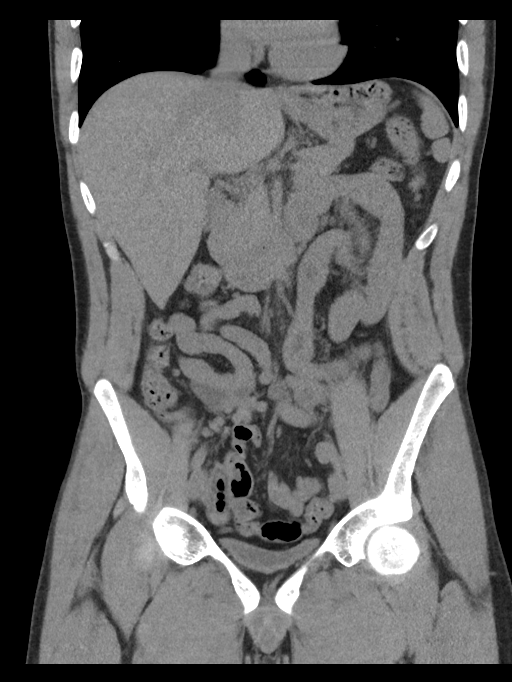
[im 46/82  soft-tissue]
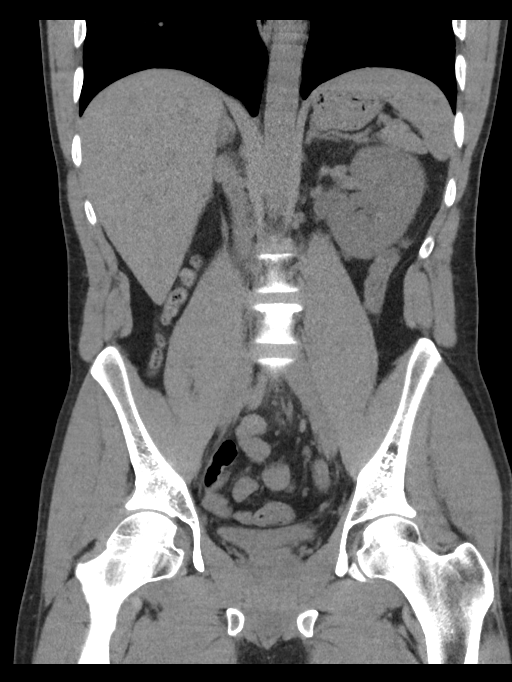

[16 of 46 positions shown; findings below may reference images not displayed]

FINDINGS: Lower chest: No acute abnormality.

Hepatobiliary: No focal liver abnormality is seen. No gallstones,
gallbladder wall thickening, or biliary dilatation.

Pancreas: Unremarkable. No pancreatic ductal dilatation or
surrounding inflammatory changes.

Spleen: Normal in size without focal abnormality.

Adrenals/Urinary Tract: The adrenal glands are unremarkable. There
is a 8 mm calculus in the right mid ureter without hydronephrosis.
There is an 12 mm calculus at the left UPJ with resultant mild
hydronephrosis. The bladder is decompressed.

Stomach/Bowel: Stomach is within normal limits. Appendix appears
normal. No evidence of bowel wall thickening, distention, or
inflammatory changes.

Vascular/Lymphatic: No significant vascular findings are present. No
enlarged abdominal or pelvic lymph nodes.

Reproductive: Prostate is unremarkable.

Other: No abdominal wall hernia or abnormality. No abdominopelvic
ascites. No pneumoperitoneum.

Musculoskeletal: No acute or significant osseous findings.
IMPRESSION: 1. 12 mm calculus at the left UPJ with resultant mild left
hydronephrosis.
2. 8 mm calculus in the right mid ureter without hydronephrosis.

## 2022-03-20 ENCOUNTER — Encounter (HOSPITAL_BASED_OUTPATIENT_CLINIC_OR_DEPARTMENT_OTHER): Payer: Self-pay | Admitting: Emergency Medicine

## 2022-03-20 ENCOUNTER — Emergency Department (HOSPITAL_BASED_OUTPATIENT_CLINIC_OR_DEPARTMENT_OTHER): Payer: Medicaid Other

## 2022-03-20 ENCOUNTER — Other Ambulatory Visit: Payer: Self-pay

## 2022-03-20 ENCOUNTER — Emergency Department (HOSPITAL_BASED_OUTPATIENT_CLINIC_OR_DEPARTMENT_OTHER)
Admission: EM | Admit: 2022-03-20 | Discharge: 2022-03-20 | Disposition: A | Discharge status: Home / Self Care | Payer: Medicaid Other | Attending: Emergency Medicine | Admitting: Emergency Medicine

## 2022-03-20 DIAGNOSIS — S0003XA Contusion of scalp, initial encounter: Secondary | ICD-10-CM | POA: Diagnosis not present

## 2022-03-20 DIAGNOSIS — S0990XA Unspecified injury of head, initial encounter: Secondary | ICD-10-CM | POA: Diagnosis present

## 2022-03-20 DIAGNOSIS — Y92481 Parking lot as the place of occurrence of the external cause: Secondary | ICD-10-CM | POA: Insufficient documentation

## 2022-03-20 DIAGNOSIS — Z23 Encounter for immunization: Secondary | ICD-10-CM | POA: Insufficient documentation

## 2022-03-20 DIAGNOSIS — R369 Urethral discharge, unspecified: Secondary | ICD-10-CM | POA: Diagnosis not present

## 2022-03-20 DIAGNOSIS — L03115 Cellulitis of right lower limb: Secondary | ICD-10-CM | POA: Insufficient documentation

## 2022-03-20 LAB — CBC WITH DIFFERENTIAL/PLATELET
Abs Immature Granulocytes: 0.05 10*3/uL (ref 0.00–0.07)
Basophils Absolute: 0.1 10*3/uL (ref 0.0–0.1)
Basophils Relative: 1 %
Eosinophils Absolute: 0.1 10*3/uL (ref 0.0–0.5)
Eosinophils Relative: 1 %
HCT: 36.9 % — ABNORMAL LOW (ref 39.0–52.0)
Hemoglobin: 11.8 g/dL — ABNORMAL LOW (ref 13.0–17.0)
Immature Granulocytes: 1 %
Lymphocytes Relative: 10 %
Lymphs Abs: 1.1 10*3/uL (ref 0.7–4.0)
MCH: 29 pg (ref 26.0–34.0)
MCHC: 32 g/dL (ref 30.0–36.0)
MCV: 90.7 fL (ref 80.0–100.0)
Monocytes Absolute: 0.7 10*3/uL (ref 0.1–1.0)
Monocytes Relative: 7 %
Neutro Abs: 8.6 10*3/uL — ABNORMAL HIGH (ref 1.7–7.7)
Neutrophils Relative %: 80 %
Platelets: 332 10*3/uL (ref 150–400)
RBC: 4.07 MIL/uL — ABNORMAL LOW (ref 4.22–5.81)
RDW: 12.4 % (ref 11.5–15.5)
WBC: 10.6 10*3/uL — ABNORMAL HIGH (ref 4.0–10.5)
nRBC: 0 % (ref 0.0–0.2)

## 2022-03-20 LAB — COMPREHENSIVE METABOLIC PANEL
ALT: 14 U/L (ref 0–44)
AST: 37 U/L (ref 15–41)
Albumin: 3.8 g/dL (ref 3.5–5.0)
Alkaline Phosphatase: 85 U/L (ref 38–126)
Anion gap: 9 (ref 5–15)
BUN: 8 mg/dL (ref 6–20)
CO2: 28 mmol/L (ref 22–32)
Calcium: 8.5 mg/dL — ABNORMAL LOW (ref 8.9–10.3)
Chloride: 104 mmol/L (ref 98–111)
Creatinine, Ser: 0.84 mg/dL (ref 0.61–1.24)
GFR, Estimated: >60 mL/min (ref >60)
Glucose, Bld: 105 mg/dL — ABNORMAL HIGH (ref 70–99)
Potassium: 3.6 mmol/L (ref 3.5–5.1)
Sodium: 141 mmol/L (ref 135–145)
Total Bilirubin: 0.5 mg/dL (ref 0.3–1.2)
Total Protein: 6.5 g/dL (ref 6.5–8.1)

## 2022-03-20 LAB — CK: Total CK: 2641 U/L — ABNORMAL HIGH (ref 49–397)

## 2022-03-20 LAB — RAPID URINE DRUG SCREEN, HOSP PERFORMED
Amphetamines: NOT DETECTED
Barbiturates: NOT DETECTED
Benzodiazepines: NOT DETECTED
Cocaine: POSITIVE — AB
Opiates: NOT DETECTED
Tetrahydrocannabinol: POSITIVE — AB

## 2022-03-20 MED ORDER — MORPHINE SULFATE (PF) 4 MG/ML IV SOLN
4.0000 mg | Freq: Once | INTRAVENOUS | Status: DC
  Filled 2022-03-20: qty 1

## 2022-03-20 MED ORDER — CEFTRIAXONE SODIUM 500 MG IJ SOLR
500.0000 mg | Freq: Once | INTRAMUSCULAR | Status: AC
  Administered 2022-03-20: 500 mg via INTRAMUSCULAR
  Filled 2022-03-20: qty 500

## 2022-03-20 MED ORDER — LIDOCAINE HCL (PF) 1 % IJ SOLN
INTRAMUSCULAR | Status: AC
  Administered 2022-03-20: 5 mL
  Filled 2022-03-20: qty 5

## 2022-03-20 MED ORDER — SODIUM CHLORIDE 0.9 % IV BOLUS
1000.0000 mL | Freq: Once | INTRAVENOUS | Status: AC
Start: 2022-03-20 — End: 2022-03-20
  Administered 2022-03-20: 1000 mL via INTRAVENOUS

## 2022-03-20 MED ORDER — DOXYCYCLINE HYCLATE 100 MG PO CAPS: 100.0000 mg | ORAL_CAPSULE | Freq: Two times a day (BID) | ORAL | 0 refills | Status: AC

## 2022-03-20 MED ORDER — TETANUS-DIPHTH-ACELL PERTUSSIS 5-2.5-18.5 LF-MCG/0.5 IM SUSY
0.5000 mL | PREFILLED_SYRINGE | Freq: Once | INTRAMUSCULAR | Status: AC
Start: 2022-03-20 — End: 2022-03-20
  Administered 2022-03-20: 0.5 mL via INTRAMUSCULAR
  Filled 2022-03-20: qty 0.5

## 2022-03-20 MED ORDER — CEFAZOLIN SODIUM-DEXTROSE 1-4 GM/50ML-% IV SOLN
1.0000 g | Freq: Once | INTRAVENOUS | Status: AC
  Administered 2022-03-20: 1 g via INTRAVENOUS
  Filled 2022-03-20: qty 50

## 2022-03-20 MED ORDER — CYCLOBENZAPRINE HCL 5 MG PO TABS: 5.0000 mg | ORAL_TABLET | Freq: Three times a day (TID) | ORAL | 0 refills | Status: AC | PRN

## 2022-03-20 MED ORDER — KETOROLAC TROMETHAMINE 30 MG/ML IJ SOLN
30.0000 mg | Freq: Once | INTRAMUSCULAR | Status: AC
  Administered 2022-03-20: 30 mg via INTRAVENOUS
  Filled 2022-03-20: qty 1

## 2022-03-20 NOTE — ED Notes (Signed)
Cannot discharge, registration in chart.

## 2022-03-20 NOTE — ED Notes (Signed)
Pt in radiology 
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
## 2022-03-21 LAB — HIV ANTIBODY (ROUTINE TESTING W REFLEX): HIV Screen 4th Generation wRfx: NONREACTIVE

## 2022-03-22 LAB — GC/CHLAMYDIA PROBE AMP (~~LOC~~) NOT AT ARMC
Chlamydia: POSITIVE — AB
Comment: NEGATIVE
Comment: NORMAL
Neisseria Gonorrhea: NEGATIVE

## 2022-05-26 ENCOUNTER — Emergency Department (HOSPITAL_BASED_OUTPATIENT_CLINIC_OR_DEPARTMENT_OTHER)
Admission: EM | Admit: 2022-05-26 | Discharge: 2022-05-26 | Disposition: A | Discharge status: Home / Self Care | Payer: Medicaid Other | Attending: Emergency Medicine | Admitting: Emergency Medicine

## 2022-05-26 ENCOUNTER — Other Ambulatory Visit: Payer: Self-pay

## 2022-05-26 ENCOUNTER — Encounter (HOSPITAL_BASED_OUTPATIENT_CLINIC_OR_DEPARTMENT_OTHER): Payer: Self-pay

## 2022-05-26 DIAGNOSIS — J45909 Unspecified asthma, uncomplicated: Secondary | ICD-10-CM | POA: Diagnosis not present

## 2022-05-26 DIAGNOSIS — K047 Periapical abscess without sinus: Secondary | ICD-10-CM

## 2022-05-26 DIAGNOSIS — K0889 Other specified disorders of teeth and supporting structures: Secondary | ICD-10-CM | POA: Diagnosis present

## 2022-05-26 MED ORDER — AMOXICILLIN 500 MG PO CAPS
500.0000 mg | ORAL_CAPSULE | Freq: Once | ORAL | Status: AC
  Administered 2022-05-26: 500 mg via ORAL
  Filled 2022-05-26: qty 1

## 2022-05-26 MED ORDER — ALBUTEROL SULFATE HFA 108 (90 BASE) MCG/ACT IN AERS
1.0000 | INHALATION_SPRAY | Freq: Once | RESPIRATORY_TRACT | Status: DC | PRN
  Filled 2022-05-26: qty 6.7

## 2022-05-26 MED ORDER — AMOXICILLIN 500 MG PO CAPS: 500.0000 mg | ORAL_CAPSULE | Freq: Three times a day (TID) | ORAL | 0 refills | Status: AC

## 2022-05-26 MED ORDER — IBUPROFEN 600 MG PO TABS: 600.0000 mg | ORAL_TABLET | Freq: Four times a day (QID) | ORAL | 0 refills | Status: AC | PRN

## 2022-05-26 MED ORDER — ALBUTEROL SULFATE HFA 108 (90 BASE) MCG/ACT IN AERS: 1.0000 | INHALATION_SPRAY | Freq: Once | RESPIRATORY_TRACT | Status: DC

## 2022-05-26 MED ORDER — IBUPROFEN 400 MG PO TABS
600.0000 mg | ORAL_TABLET | Freq: Once | ORAL | Status: AC
  Administered 2022-05-26: 600 mg via ORAL
  Filled 2022-05-26: qty 1

## 2022-05-26 NOTE — ED Provider Notes (Signed)
[place] [facility] Provider Note   CSN: [id number] Arrival date & time: 05/26/22  0522     History  Chief Complaint  Patient presents with   Dental Pain    Colton Simon is a 42 y.o. male.  The history is provided by the patient.  Dental Pain Colton Simon is a 42 y.o. male who presents to the Emergency Department complaining of dental pain.  He Zentz to the emergency department for evaluation of dental pain that started yesterday.  He states that he took Tylenol and went to sleep and woke up with worsening pain.  He feels like it swollen just above the affected tooth.  He does not have a dentist.  No fevers, nausea, vomiting, difficulty swallowing or difficulty breathing.  He does have a history of asthma.     Home Medications Prior to Admission medications   Medication Sig Start Date End Date Taking? Authorizing Provider  amoxicillin (AMOXIL) 500 MG capsule Take 1 capsule (500 mg total) by mouth 3 (three) times daily. 05/26/22  Yes Tilden Fossa, MD  ibuprofen (ADVIL) 600 MG tablet Take 1 tablet (600 mg total) by mouth every 6 (six) hours as needed. 05/26/22  Yes Tilden Fossa, MD  cyclobenzaprine (FLEXERIL) 5 MG tablet Take 1 tablet (5 mg total) by mouth 3 (three) times daily as needed. 03/20/22   Charlynne Pander, MD  doxycycline (VIBRAMYCIN) 100 MG capsule Take 1 capsule (100 mg total) by mouth 2 (two) times daily. One po bid x 7 days 03/20/22   Charlynne Pander, MD  HYDROcodone-acetaminophen (NORCO/VICODIN) 5-325 MG tablet Take 1 tablet by mouth every 6 (six) hours as needed for moderate pain.    [provider]  HYDROcodone-acetaminophen (NORCO/VICODIN) 5-325 MG tablet Take 1-2 tablets by mouth every 6 (six) hours as needed. 04/14/17   Heloise Purpura, MD  phenazopyridine (PYRIDIUM) 100 MG tablet Take 1 tablet (100 mg total) by mouth 3 (three) times daily as needed for pain (for burning). 04/14/17   Heloise Purpura, MD   sulfamethoxazole-trimethoprim (BACTRIM DS,SEPTRA DS) 800-160 MG tablet Take 1 tablet by mouth 2 (two) times daily. Start the night before you remove your stent. 04/14/17   Heloise Purpura, MD      Allergies    Patient has no known allergies.    Review of Systems   Review of Systems  All other systems reviewed and are negative.   Physical Exam Updated Vital Signs BP 121/83   Pulse (!) 102   Temp 98.1 F (36.7 C)   Resp 18   Ht 5\' 10"  (1.778 m)   Wt 65.8 kg   SpO2 96%   BMI 20.81 kg/m  Physical Exam Vitals and nursing note reviewed.  Constitutional:      Appearance: He is well-developed.  HENT:     Head: Normocephalic and atraumatic.     Comments: Poor dentition.  Tooth 3 is broken with tenderness between tooth 2 and 3 without any discrete fullness.  There is no edema or erythema in the posterior oropharynx. Cardiovascular:     Rate and Rhythm: Normal rate and regular rhythm.     Heart sounds: No murmur heard. Pulmonary:     Effort: Pulmonary effort is normal. No respiratory distress.     Breath sounds: Normal breath sounds.  Musculoskeletal:        General: No tenderness.     Cervical back: Neck supple.  Skin:    General: Skin is warm and dry.  Neurological:     Mental Status: He is alert and oriented to person, place, and time.  Psychiatric:        Behavior: Behavior normal.     ED Results / Procedures / Treatments   Labs (all labs ordered are listed, but only abnormal results are displayed) Labs Reviewed - No data to display  EKG None  Radiology No results found.  Procedures Procedures    Medications Ordered in ED Medications  albuterol (VENTOLIN HFA) 108 (90 Base) MCG/ACT inhaler 1 puff (has no administration in time range)  ibuprofen (ADVIL) tablet 600 mg (has no administration in time range)  amoxicillin (AMOXIL) capsule 500 mg (has no administration in time range)    ED Course/ Medical Decision Making/ A&P                              Medical Decision Making Risk Prescription drug management.   Patient here for evaluation of dental pain.  He does have broken and carious teeth on examination.  He has a sensation of fullness, that is not fully appreciated on examination.  Suspect that he has an early abscess and will start antibiotics.  No evidence of Ludwig angina, sepsis.  Patient is also requesting albuterol inhaler, concerned that he will have an asthma flareup and does not have access to one currently.  On examination he has clear lungs with no respiratory distress.  Will prescribe MDI in the event he has difficulties prior to being able to get his medications from the pharmacy.  Plan to discharge with outpatient dentistry follow-up with return precautions.        Final Clinical Impression(s) / ED Diagnoses Final diagnoses:  Dental abscess    Rx / DC Orders ED Discharge Orders          Ordered    amoxicillin (AMOXIL) 500 MG capsule  3 times daily        05/26/22 0543    ibuprofen (ADVIL) 600 MG tablet  Every 6 hours PRN        05/26/22 0543              Tilden Fossa, MD 05/26/22 [phone]

## 2022-05-26 NOTE — ED Triage Notes (Signed)
Right upper dental pain beginning this morning after waking up. Suspects he has a broken tooth.

## 2022-07-06 ENCOUNTER — Emergency Department (HOSPITAL_BASED_OUTPATIENT_CLINIC_OR_DEPARTMENT_OTHER): Payer: No Typology Code available for payment source

## 2022-07-06 ENCOUNTER — Other Ambulatory Visit: Payer: Self-pay

## 2022-07-06 ENCOUNTER — Emergency Department (HOSPITAL_BASED_OUTPATIENT_CLINIC_OR_DEPARTMENT_OTHER)
Admission: EM | Admit: 2022-07-06 | Discharge: 2022-07-06 | Disposition: A | Discharge status: Home / Self Care | Payer: No Typology Code available for payment source | Attending: Emergency Medicine | Admitting: Emergency Medicine

## 2022-07-06 ENCOUNTER — Encounter (HOSPITAL_BASED_OUTPATIENT_CLINIC_OR_DEPARTMENT_OTHER): Payer: Self-pay

## 2022-07-06 DIAGNOSIS — R0789 Other chest pain: Secondary | ICD-10-CM | POA: Diagnosis not present

## 2022-07-06 DIAGNOSIS — F1490 Cocaine use, unspecified, uncomplicated: Secondary | ICD-10-CM | POA: Diagnosis not present

## 2022-07-06 DIAGNOSIS — R0602 Shortness of breath: Secondary | ICD-10-CM | POA: Insufficient documentation

## 2022-07-06 DIAGNOSIS — R079 Chest pain, unspecified: Secondary | ICD-10-CM | POA: Diagnosis present

## 2022-07-06 LAB — URINALYSIS, ROUTINE W REFLEX MICROSCOPIC
Bilirubin Urine: NEGATIVE
Glucose, UA: NEGATIVE mg/dL
Ketones, ur: NEGATIVE mg/dL
Leukocytes,Ua: NEGATIVE
Nitrite: NEGATIVE
Protein, ur: 30 mg/dL — AB
Specific Gravity, Urine: >=1.03 (ref 1.005–1.030)
pH: 6.5 (ref 5.0–8.0)

## 2022-07-06 LAB — CBC WITH DIFFERENTIAL/PLATELET
Abs Immature Granulocytes: 0.01 10*3/uL (ref 0.00–0.07)
Basophils Absolute: 0.1 10*3/uL (ref 0.0–0.1)
Basophils Relative: 1 %
Eosinophils Absolute: 0.4 10*3/uL (ref 0.0–0.5)
Eosinophils Relative: 6 %
HCT: 42.5 % (ref 39.0–52.0)
Hemoglobin: 13.6 g/dL (ref 13.0–17.0)
Immature Granulocytes: 0 %
Lymphocytes Relative: 20 %
Lymphs Abs: 1.6 10*3/uL (ref 0.7–4.0)
MCH: 28.8 pg (ref 26.0–34.0)
MCHC: 32 g/dL (ref 30.0–36.0)
MCV: 90 fL (ref 80.0–100.0)
Monocytes Absolute: 0.7 10*3/uL (ref 0.1–1.0)
Monocytes Relative: 8 %
Neutro Abs: 5.2 10*3/uL (ref 1.7–7.7)
Neutrophils Relative %: 65 %
Platelets: 305 10*3/uL (ref 150–400)
RBC: 4.72 MIL/uL (ref 4.22–5.81)
RDW: 12.7 % (ref 11.5–15.5)
WBC: 8 10*3/uL (ref 4.0–10.5)
nRBC: 0 % (ref 0.0–0.2)

## 2022-07-06 LAB — RAPID URINE DRUG SCREEN, HOSP PERFORMED
Amphetamines: NOT DETECTED
Barbiturates: NOT DETECTED
Benzodiazepines: NOT DETECTED
Cocaine: POSITIVE — AB
Opiates: NOT DETECTED
Tetrahydrocannabinol: POSITIVE — AB

## 2022-07-06 LAB — TROPONIN I (HIGH SENSITIVITY)
Troponin I (High Sensitivity): <2 ng/L (ref <18)
Troponin I (High Sensitivity): <2 ng/L (ref <18)

## 2022-07-06 LAB — COMPREHENSIVE METABOLIC PANEL
ALT: 17 U/L (ref 0–44)
AST: 24 U/L (ref 15–41)
Albumin: 4.2 g/dL (ref 3.5–5.0)
Alkaline Phosphatase: 92 U/L (ref 38–126)
Anion gap: 10 (ref 5–15)
BUN: 19 mg/dL (ref 6–20)
CO2: 26 mmol/L (ref 22–32)
Calcium: 8.7 mg/dL — ABNORMAL LOW (ref 8.9–10.3)
Chloride: 103 mmol/L (ref 98–111)
Creatinine, Ser: 1 mg/dL (ref 0.61–1.24)
GFR, Estimated: >60 mL/min (ref >60)
Glucose, Bld: 103 mg/dL — ABNORMAL HIGH (ref 70–99)
Potassium: 3.7 mmol/L (ref 3.5–5.1)
Sodium: 139 mmol/L (ref 135–145)
Total Bilirubin: 0.4 mg/dL (ref 0.3–1.2)
Total Protein: 6.7 g/dL (ref 6.5–8.1)

## 2022-07-06 LAB — URINALYSIS, MICROSCOPIC (REFLEX)

## 2022-07-06 LAB — D-DIMER, QUANTITATIVE: D-Dimer, Quant: 0.27 ug/mL-FEU (ref 0.00–0.50)

## 2022-07-06 MED ORDER — ASPIRIN 81 MG PO CHEW
324.0000 mg | CHEWABLE_TABLET | Freq: Once | ORAL | Status: AC
  Administered 2022-07-06: 324 mg via ORAL
  Filled 2022-07-06: qty 4

## 2022-07-06 NOTE — ED Triage Notes (Signed)
Pt reports sharp CP that prevents him from taking deep breaths that started around 4:30 pm. Pt was asleep and woke him up from his sleep. Pt denies hx of PE and MI. Pt a smoker.

## 2022-07-06 NOTE — ED Provider Notes (Signed)
[place] [facility] Provider Note   CSN: [id number] Arrival date & time: 07/06/22  0240     History  Chief Complaint  Patient presents with   Chest Pain    Colton Simon is a 42 y.o. male.  Patient with a history of no chronic medical conditions here with intermittent left-sided chest pain since yesterday afternoon.  Describes sharp episodes of pain to his left chest that comes and goes lasting for several seconds at a time.  States sharp stabbing chest pain to left chest lasting at the most 30 seconds at a time.  He has had 4-5 episodes of these total.  Pain is associate with some shortness of breath.  No nausea, vomiting, sweating, pain with exertion.  Does not become short of breath when the pain comes on.  No radiation of the pain to his arm, neck or back.  No abdominal pain.  No cough or fever.  Denies any cardiac history.  Has never had a stress test or catheterization.  Smokes cigarettes and marijuana.  Last cocaine use was about a week ago.  Does not use cocaine regularly.  Has had this pain intermittently in the past but not for many months.  Is never been this severe before.  Pain comes and goes lasting for a few seconds at a time.  States the longest the pain lasts is about 30 seconds.  Not having any pain currently.  The history is provided by the patient.  Chest Pain Associated symptoms: shortness of breath   Associated symptoms: no abdominal pain, no dizziness, no fever, no headache, no nausea, no vomiting and no weakness        Home Medications Prior to Admission medications   Medication Sig Start Date End Date Taking? Authorizing Provider  amoxicillin (AMOXIL) 500 MG capsule Take 1 capsule (500 mg total) by mouth 3 (three) times daily. 05/26/22   Tilden Fossa, MD  cyclobenzaprine (FLEXERIL) 5 MG tablet Take 1 tablet (5 mg total) by mouth 3 (three) times daily as needed. 03/20/22   Charlynne Pander, MD  doxycycline (VIBRAMYCIN)  100 MG capsule Take 1 capsule (100 mg total) by mouth 2 (two) times daily. One po bid x 7 days 03/20/22   Charlynne Pander, MD  HYDROcodone-acetaminophen (NORCO/VICODIN) 5-325 MG tablet Take 1 tablet by mouth every 6 (six) hours as needed for moderate pain.    [provider]  HYDROcodone-acetaminophen (NORCO/VICODIN) 5-325 MG tablet Take 1-2 tablets by mouth every 6 (six) hours as needed. 04/14/17   Heloise Purpura, MD  ibuprofen (ADVIL) 600 MG tablet Take 1 tablet (600 mg total) by mouth every 6 (six) hours as needed. 05/26/22   Tilden Fossa, MD  phenazopyridine (PYRIDIUM) 100 MG tablet Take 1 tablet (100 mg total) by mouth 3 (three) times daily as needed for pain (for burning). 04/14/17   Heloise Purpura, MD  sulfamethoxazole-trimethoprim (BACTRIM DS,SEPTRA DS) 800-160 MG tablet Take 1 tablet by mouth 2 (two) times daily. Start the night before you remove your stent. 04/14/17   Heloise Purpura, MD      Allergies    Patient has no known allergies.    Review of Systems   Review of Systems  Constitutional:  Negative for activity change, appetite change and fever.  HENT:  Negative for congestion and rhinorrhea.   Respiratory:  Positive for chest tightness and shortness of breath.   Cardiovascular:  Positive for chest pain.  Gastrointestinal:  Negative for abdominal pain, nausea  and vomiting.  Genitourinary:  Negative for dysuria and hematuria.  Musculoskeletal:  Negative for arthralgias and myalgias.  Skin:  Negative for rash.  Neurological:  Negative for dizziness, weakness and headaches.   all other systems are negative except as noted in the HPI and PMH.    Physical Exam Updated Vital Signs BP 118/74 (BP Location: Left Arm)   Pulse (!) 111   Temp 98.3 F (36.8 C) (Oral)   Resp 18   Ht 5\' 10"  (1.778 m)   Wt 70.3 kg   SpO2 97%   BMI 22.24 kg/m  Physical Exam Vitals and nursing note reviewed.  Constitutional:      General: He is not in acute distress.    Appearance: He is  well-developed.  HENT:     Head: Normocephalic and atraumatic.     Mouth/Throat:     Pharynx: No oropharyngeal exudate.  Eyes:     Conjunctiva/sclera: Conjunctivae normal.     Pupils: Pupils are equal, round, and reactive to light.  Neck:     Comments: No meningismus. Cardiovascular:     Rate and Rhythm: Normal rate and regular rhythm.     Heart sounds: Normal heart sounds. No murmur heard. Pulmonary:     Effort: Pulmonary effort is normal. No respiratory distress.     Breath sounds: Normal breath sounds.  Chest:     Chest wall: No tenderness.  Abdominal:     Palpations: Abdomen is soft.     Tenderness: There is no abdominal tenderness. There is no guarding or rebound.  Musculoskeletal:        General: No tenderness. Normal range of motion.     Cervical back: Normal range of motion and neck supple.  Skin:    General: Skin is warm.  Neurological:     Mental Status: He is alert and oriented to person, place, and time.     Cranial Nerves: No cranial nerve deficit.     Motor: No abnormal muscle tone.     Coordination: Coordination normal.     Comments: No ataxia on finger to nose bilaterally. No pronator drift. 5/5 strength throughout. CN 2-12 intact.Equal grip strength. Sensation intact.   Psychiatric:        Behavior: Behavior normal.     ED Results / Procedures / Treatments   Labs (all labs ordered are listed, but only abnormal results are displayed) Labs Reviewed  RAPID URINE DRUG SCREEN, HOSP PERFORMED - Abnormal; Notable for the following components:      Result Value   Cocaine POSITIVE (*)    Tetrahydrocannabinol POSITIVE (*)    All other components within normal limits  COMPREHENSIVE METABOLIC PANEL - Abnormal; Notable for the following components:   Glucose, Bld 103 (*)    Calcium 8.7 (*)    All other components within normal limits  URINALYSIS, ROUTINE W REFLEX MICROSCOPIC - Abnormal; Notable for the following components:   Hgb urine dipstick TRACE (*)     Protein, ur 30 (*)    All other components within normal limits  URINALYSIS, MICROSCOPIC (REFLEX) - Abnormal; Notable for the following components:   Bacteria, UA RARE (*)    All other components within normal limits  CBC WITH DIFFERENTIAL/PLATELET  D-DIMER, QUANTITATIVE  TROPONIN I (HIGH SENSITIVITY)  TROPONIN I (HIGH SENSITIVITY)    EKG EKG Interpretation  Date/Time:  Saturday July 06 2022 02:50:01 EDT Ventricular Rate:  110 PR Interval:  109 QRS Duration: 93 QT Interval:  325 QTC Calculation: 440 R  Axis:   71 Text Interpretation: Sinus tachycardia Rate faster Confirmed by Glynn Octave [phone]) on 07/06/2022 3:20:34 AM  Radiology DG Chest 2 View  Result Date: 07/06/2022 CLINICAL DATA:  42 year old male with chest pain, pleuritic pain since 1630 hours. EXAM: CHEST - 2 VIEW COMPARISON:  Chest radiographs 03/20/2022 and earlier. FINDINGS: PA and lateral views at 0400 hours. Lung volumes and mediastinal contours remain normal. Visualized tracheal air column is within normal limits. Lung markings appear stable, both lungs appear clear with no pneumothorax or pleural effusion. No osseous abnormality identified. Negative visible bowel gas. IMPRESSION: Negative.  No cardiopulmonary abnormality. Electronically Signed   By: Odessa Fleming M.D.   On: 07/06/2022 04:08    Procedures Procedures    Medications Ordered in ED Medications  aspirin chewable tablet 324 mg (has no administration in time range)    ED Course/ Medical Decision Making/ A&P                             Medical Decision Making Amount and/or Complexity of Data Reviewed Labs: ordered. Decision-making details documented in ED Course. Radiology: ordered and independent interpretation performed. Decision-making details documented in ED Course. ECG/medicine tests: ordered and independent interpretation performed. Decision-making details documented in ED Course.  Risk OTC drugs.   Intermittent left-sided chest pain  since yesterday afternoon.  Coming and going.  No chest pain currently.  EKG is sinus tachycardia without acute ST changes.  Pain not reproducible.  Pain last for just a few seconds at a time.  Troponin negative x 1.  D-dimer negative.  Chest x-ray negative for pneumonia or pneumothorax Heart score is 1.  Pain is atypical for ACS given his description.  Low suspicion for PE or aortic dissection with negative D-dimer. UDS is positive for cocaine.  Chest x-ray negative for infiltrate or pneumothorax.  Results reviewed interpreted by me.  Pain last for just 30 seconds at a time.  Not consistent with ACS.  Troponin remains negative.  Troponin negative x 2.  No episodes of chest pain throughout ED stay.  Description of chest pain is brief and atypical for ACS.  Pain last for just a few seconds at a time and does not radiate.  No associated nausea, vomiting or diaphoresis.  Suspect likely chest pain secondary to cocaine use.  Discussed with patient he is low risk for ACS but nonzero risk.  Recommend cessation of cocaine use and follow-up with cardiology for stress test and consideration of cardiac catheterization.  Discussed return to the ED with exertional chest pain, pain associate with shortness of breath, nausea, vomiting, diaphoresis, or other concerns.        Final Clinical Impression(s) / ED Diagnoses Final diagnoses:  Atypical chest pain  Cocaine use    Rx / DC Orders ED Discharge Orders     None         Gaylord Seydel, Jeannett Senior, MD 07/06/22 [phone]

## 2022-07-06 NOTE — Discharge Instructions (Signed)
Your testing is now different heart attack.  As we discussed you are low risk for heart disease but nonzero risk.  Follow-up with the cardiologist for a stress test.  Stop using cocaine.  Return to the ED with exertional chest pain, pain associated with shortness of breath, nausea, vomiting, sweating, or other concerns.

## 2022-07-09 ENCOUNTER — Ambulatory Visit (HOSPITAL_BASED_OUTPATIENT_CLINIC_OR_DEPARTMENT_OTHER): Payer: Medicaid Other | Admitting: Internal Medicine

## 2022-10-03 ENCOUNTER — Encounter (HOSPITAL_BASED_OUTPATIENT_CLINIC_OR_DEPARTMENT_OTHER): Payer: Self-pay | Admitting: Emergency Medicine

## 2022-10-03 ENCOUNTER — Emergency Department (HOSPITAL_BASED_OUTPATIENT_CLINIC_OR_DEPARTMENT_OTHER)
Admission: EM | Admit: 2022-10-03 | Discharge: 2022-10-03 | Disposition: A | Discharge status: Home / Self Care | Payer: No Typology Code available for payment source | Attending: Emergency Medicine | Admitting: Emergency Medicine

## 2022-10-03 ENCOUNTER — Other Ambulatory Visit: Payer: Self-pay

## 2022-10-03 DIAGNOSIS — K0889 Other specified disorders of teeth and supporting structures: Secondary | ICD-10-CM | POA: Insufficient documentation

## 2022-10-03 DIAGNOSIS — J45909 Unspecified asthma, uncomplicated: Secondary | ICD-10-CM | POA: Diagnosis not present

## 2022-10-03 MED ORDER — OXYCODONE-ACETAMINOPHEN 5-325 MG PO TABS: 1.0000 | ORAL_TABLET | Freq: Four times a day (QID) | ORAL | 0 refills | Status: AC | PRN

## 2022-10-03 MED ORDER — AMOXICILLIN 500 MG PO CAPS: 500.0000 mg | ORAL_CAPSULE | Freq: Three times a day (TID) | ORAL | 0 refills | Status: AC

## 2022-10-03 NOTE — Discharge Instructions (Addendum)
Please take tylenol/ibuprofen/naproxen or Percocet for pain.  Take your antibiotics as prescribed.  I recommend close follow-up with a dentist for reevaluation.  Please do not hesitate to return to emergency department if worrisome signs symptoms we discussed become apparent.

## 2022-10-03 NOTE — ED Provider Notes (Signed)
[place] [facility] Provider Note   CSN: [id number] Arrival date & time: 10/03/22  2058     History  Chief Complaint  Patient presents with   Dental Pain    Colton Simon is a 42 y.o. male history of asthma presents for evaluation of today.  Pain is in the right upper jaw near his wisdom tooth.  Denies any trouble breathing or swallowing.  Denies fever at home.  Has tried Tylenol at home with minimal relief.   Dental Pain   Past Medical History:  Diagnosis Date   Asthma    Headache    migraines-none in last few years   History of kidney stones    Kidney stone    Past Surgical History:  Procedure Laterality Date   CYSTOSCOPY WITH RETROGRADE PYELOGRAM, URETEROSCOPY AND STENT PLACEMENT Bilateral 03/12/2017   Procedure: CYSTOSCOPY WITH RETROGRADE PYELOGRAM, STENT PLACEMENT;  Surgeon: Heloise Purpura, MD;  Location: [facility];  Service: Urology;  Laterality: Bilateral;   CYSTOSCOPY/URETEROSCOPY/HOLMIUM LASER/STENT PLACEMENT Bilateral 04/14/2017   Procedure: CYSTOSCOPY/URETEROSCOPY/HOLMIUM LASER/ LEFT STENT PLACEMENT;  Surgeon: Heloise Purpura, MD;  Location: [facility];  Service: Urology;  Laterality: Bilateral;     Home Medications Prior to Admission medications   Medication Sig Start Date End Date Taking? Authorizing Provider  amoxicillin (AMOXIL) 500 MG capsule Take 1 capsule (500 mg total) by mouth 3 (three) times daily. 10/03/22  Yes Jeanelle Malling, PA  oxyCODONE-acetaminophen (PERCOCET/ROXICET) 5-325 MG tablet Take 1 tablet by mouth every 6 (six) hours as needed for up to 10 doses for severe pain. 10/03/22  Yes Jeanelle Malling, PA  amoxicillin (AMOXIL) 500 MG capsule Take 1 capsule (500 mg total) by mouth 3 (three) times daily. 05/26/22   Tilden Fossa, MD  cyclobenzaprine (FLEXERIL) 5 MG tablet Take 1 tablet (5 mg total) by mouth 3 (three) times daily as needed. 03/20/22   Charlynne Pander, MD  doxycycline (VIBRAMYCIN) 100 MG capsule Take 1 capsule (100 mg  total) by mouth 2 (two) times daily. One po bid x 7 days 03/20/22   Charlynne Pander, MD  HYDROcodone-acetaminophen (NORCO/VICODIN) 5-325 MG tablet Take 1 tablet by mouth every 6 (six) hours as needed for moderate pain.    [provider]  HYDROcodone-acetaminophen (NORCO/VICODIN) 5-325 MG tablet Take 1-2 tablets by mouth every 6 (six) hours as needed. 04/14/17   Heloise Purpura, MD  ibuprofen (ADVIL) 600 MG tablet Take 1 tablet (600 mg total) by mouth every 6 (six) hours as needed. 05/26/22   Tilden Fossa, MD  phenazopyridine (PYRIDIUM) 100 MG tablet Take 1 tablet (100 mg total) by mouth 3 (three) times daily as needed for pain (for burning). 04/14/17   Heloise Purpura, MD  sulfamethoxazole-trimethoprim (BACTRIM DS,SEPTRA DS) 800-160 MG tablet Take 1 tablet by mouth 2 (two) times daily. Start the night before you remove your stent. 04/14/17   Heloise Purpura, MD      Allergies    Patient has no known allergies.    Review of Systems   Review of Systems Negative except as per HPI.  Physical Exam Updated Vital Signs BP 109/76   Pulse 82   Temp 98.3 F (36.8 C) (Oral)   Resp 16   Ht 5\' 10"  (1.778 m)   Wt 70.8 kg   SpO2 98%   BMI 22.38 kg/m  Physical Exam Vitals and nursing note reviewed.  Constitutional:      Appearance: Normal appearance.  HENT:     Head: Normocephalic and atraumatic.  Mouth/Throat:     Mouth: Mucous membranes are moist.     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  No abscess noted. Eyes:     General: No scleral icterus. Cardiovascular:     Rate and Rhythm: Normal rate and regular rhythm.     Pulses: Normal pulses.     Heart sounds: Normal heart sounds.  Pulmonary:     Effort: Pulmonary effort is normal.     Breath sounds: Normal breath sounds.  Abdominal:     General: Abdomen is flat.     Palpations: Abdomen is soft.     Tenderness: There is no abdominal tenderness.  Musculoskeletal:        General: No deformity.  Skin:    General: Skin  is warm.     Findings: No rash.  Neurological:     General: No focal deficit present.     Mental Status: He is alert.  Psychiatric:        Mood and Affect: Mood normal.     ED Results / Procedures / Treatments   Labs (all labs ordered are listed, but only abnormal results are displayed) Labs Reviewed - No data to display  EKG None  Radiology No results found.  Procedures Procedures    Medications Ordered in ED Medications - No data to display  ED Course/ Medical Decision Making/ A&P                                 Medical Decision Making Risk Prescription drug management.   This patient presents to the ED for today, this involves an extensive number of treatment options, and is a complaint that carries with a high risk of complications and morbidity.  The differential diagnosis includes dental abscess, gingivitis, periapical abscess.  This is not an exhaustive list.  Problem list/ ED course/ Critical interventions/ Medical management: HPI: See above Vital signs within normal range and stable throughout visit. Laboratory/imaging studies significant for: See above. On physical examination, patient is afebrile and appears in no acute distress. Patient presents for dental pain due to suspected dental cary. Patient not immunosuppressed, afebrile and well appearing with patent airway, have low suspicfion for deep space infection or any concern for airway compromise. No evidence of RPA, PTA, Ludwig's angina, periapical abscess. Instructed patient to continue to treat pain with ibuprofen/acetaminophen until they see a dentist.  I will send an Rx of amoxicillin.  Patient discharged home and will follow up with dentist. Discussed return precautions for odontogenic infections and other dental pain emergencies. Will provide dental clinic list.  I have reviewed the patient home medicines and have made adjustments as needed.  Cardiac monitoring/EKG: The patient was maintained on a  cardiac monitor.  I personally reviewed and interpreted the cardiac monitor which showed an underlying rhythm of: sinus rhythm.  Additional history obtained: External records from outside source obtained and reviewed including: Chart review including previous notes, labs, imaging.  Consultations obtained:  Disposition Continued outpatient therapy. Follow-up with dentistry recommended for reevaluation of symptoms. Treatment plan discussed with patient.  Pt acknowledged understanding was agreeable to the plan. Worrisome signs and symptoms were discussed with patient, and patient acknowledged understanding to return to the ED if they noticed these signs and symptoms. Patient was stable upon discharge.   This chart was dictated using voice recognition software.  Despite best efforts to proofread,  errors can occur which can change the documentation  meaning.          Final Clinical Impression(s) / ED Diagnoses Final diagnoses:  Pain, dental    Rx / DC Orders ED Discharge Orders          Ordered    amoxicillin (AMOXIL) 500 MG capsule  3 times daily        10/03/22 2305    oxyCODONE-acetaminophen (PERCOCET/ROXICET) 5-325 MG tablet  Every 6 hours PRN        10/03/22 2305              Jeanelle Malling, PA 10/04/22 1101    Sloan Leiter, DO 10/06/22 2355

## 2022-10-03 NOTE — ED Triage Notes (Signed)
Pt c/o dental pain (RT upper)
